# Patient Record
Sex: Female | Born: 1970 | Race: White | Hispanic: No | Marital: Married | Smoking: Current every day smoker
Health system: Southern US, Community
[De-identification: ages and names within clinical notes are randomized; demographics above are authoritative.]

## PROBLEM LIST (undated history)

## (undated) DIAGNOSIS — R7303 Prediabetes: Secondary | ICD-10-CM

## (undated) DIAGNOSIS — N183 Chronic kidney disease, stage 3 unspecified: Secondary | ICD-10-CM

## (undated) DIAGNOSIS — M1990 Unspecified osteoarthritis, unspecified site: Secondary | ICD-10-CM

## (undated) DIAGNOSIS — K219 Gastro-esophageal reflux disease without esophagitis: Secondary | ICD-10-CM

## (undated) DIAGNOSIS — F17200 Nicotine dependence, unspecified, uncomplicated: Secondary | ICD-10-CM

## (undated) DIAGNOSIS — E7800 Pure hypercholesterolemia, unspecified: Secondary | ICD-10-CM

## (undated) DIAGNOSIS — F319 Bipolar disorder, unspecified: Secondary | ICD-10-CM

## (undated) DIAGNOSIS — J45909 Unspecified asthma, uncomplicated: Secondary | ICD-10-CM

## (undated) HISTORY — DX: Pure hypercholesterolemia, unspecified: E78.00

## (undated) HISTORY — DX: Nicotine dependence, unspecified, uncomplicated: F17.200

## (undated) HISTORY — PX: OVARIAN CYST REMOVAL: SHX89

## (undated) HISTORY — PX: TUBAL LIGATION: SHX77

## (undated) HISTORY — DX: Bipolar disorder, unspecified: F31.9

## (undated) HISTORY — DX: Unspecified asthma, uncomplicated: J45.909

## (undated) HISTORY — DX: Gastro-esophageal reflux disease without esophagitis: K21.9

## (undated) HISTORY — DX: Prediabetes: R73.03

## (undated) HISTORY — PX: GALLBLADDER SURGERY: SHX652

## (undated) HISTORY — DX: Chronic kidney disease, stage 3 unspecified: N18.30

---

## 2000-01-02 ENCOUNTER — Ambulatory Visit (HOSPITAL_COMMUNITY): Admission: RE | Admit: 2000-01-02 | Discharge: 2000-01-02 | Payer: Self-pay | Admitting: *Deleted

## 2000-01-06 ENCOUNTER — Encounter: Admission: RE | Admit: 2000-01-06 | Discharge: 2000-01-06 | Payer: Self-pay | Admitting: Family Medicine

## 2000-01-06 ENCOUNTER — Encounter: Payer: Self-pay | Admitting: Family Medicine

## 2007-12-17 ENCOUNTER — Emergency Department (HOSPITAL_COMMUNITY): Admission: EM | Admit: 2007-12-17 | Discharge: 2007-12-17 | Payer: Self-pay | Admitting: Emergency Medicine

## 2008-07-22 ENCOUNTER — Emergency Department (HOSPITAL_COMMUNITY): Admission: EM | Admit: 2008-07-22 | Discharge: 2008-07-22 | Payer: Self-pay | Admitting: Emergency Medicine

## 2008-07-22 ENCOUNTER — Inpatient Hospital Stay (HOSPITAL_COMMUNITY): Admission: AD | Admit: 2008-07-22 | Discharge: 2008-07-24 | Payer: Self-pay | Admitting: *Deleted

## 2008-07-22 ENCOUNTER — Ambulatory Visit: Payer: Self-pay | Admitting: *Deleted

## 2010-03-27 ENCOUNTER — Emergency Department (HOSPITAL_COMMUNITY): Admission: EM | Admit: 2010-03-27 | Discharge: 2010-03-27 | Payer: Self-pay | Admitting: Emergency Medicine

## 2010-12-01 LAB — DIFFERENTIAL
Basophils Absolute: 0.2 10*3/uL — ABNORMAL HIGH (ref 0.0–0.1)
Basophils Relative: 2 % — ABNORMAL HIGH (ref 0–1)
Eosinophils Absolute: 0.4 10*3/uL (ref 0.0–0.7)
Eosinophils Relative: 4 % (ref 0–5)
Lymphocytes Relative: 33 % (ref 12–46)
Lymphs Abs: 3.6 10*3/uL (ref 0.7–4.0)
Monocytes Absolute: 0.6 10*3/uL (ref 0.1–1.0)
Monocytes Relative: 5 % (ref 3–12)
Neutro Abs: 6.3 10*3/uL (ref 1.7–7.7)
Neutrophils Relative %: 56 % (ref 43–77)

## 2010-12-01 LAB — COMPREHENSIVE METABOLIC PANEL
ALT: 19 U/L (ref 0–35)
AST: 17 U/L (ref 0–37)
Albumin: 4 g/dL (ref 3.5–5.2)
Alkaline Phosphatase: 70 U/L (ref 39–117)
BUN: 5 mg/dL — ABNORMAL LOW (ref 6–23)
CO2: 25 mEq/L (ref 19–32)
Calcium: 9 mg/dL (ref 8.4–10.5)
Chloride: 106 mEq/L (ref 96–112)
Creatinine, Ser: 0.93 mg/dL (ref 0.4–1.2)
GFR calc Af Amer: >60 mL/min (ref >60)
GFR calc non Af Amer: >60 mL/min (ref >60)
Glucose, Bld: 102 mg/dL — ABNORMAL HIGH (ref 70–99)
Potassium: 3.7 mEq/L (ref 3.5–5.1)
Sodium: 138 mEq/L (ref 135–145)
Total Bilirubin: 0.4 mg/dL (ref 0.3–1.2)
Total Protein: 6.5 g/dL (ref 6.0–8.3)

## 2010-12-01 LAB — POCT I-STAT, CHEM 8
BUN: 5 mg/dL — ABNORMAL LOW (ref 6–23)
Calcium, Ion: 1.12 mmol/L (ref 1.12–1.32)
Chloride: 105 mEq/L (ref 96–112)
Creatinine, Ser: 1 mg/dL (ref 0.4–1.2)
Glucose, Bld: 96 mg/dL (ref 70–99)
HCT: 42 % (ref 36.0–46.0)
Hemoglobin: 14.3 g/dL (ref 12.0–15.0)
Potassium: 3.7 mEq/L (ref 3.5–5.1)
Sodium: 140 mEq/L (ref 135–145)
TCO2: 24 mmol/L (ref 0–100)

## 2010-12-01 LAB — URINALYSIS, ROUTINE W REFLEX MICROSCOPIC
Bilirubin Urine: NEGATIVE
Glucose, UA: NEGATIVE mg/dL
Hgb urine dipstick: NEGATIVE
Ketones, ur: NEGATIVE mg/dL
Nitrite: NEGATIVE
Protein, ur: NEGATIVE mg/dL
Specific Gravity, Urine: 1.009 (ref 1.005–1.030)
Urobilinogen, UA: 0.2 mg/dL (ref 0.0–1.0)
pH: 6 (ref 5.0–8.0)

## 2010-12-01 LAB — LIPASE, BLOOD: Lipase: 26 U/L (ref 11–59)

## 2010-12-01 LAB — POCT PREGNANCY, URINE: Preg Test, Ur: NEGATIVE

## 2010-12-01 LAB — CBC
HCT: 39.8 % (ref 36.0–46.0)
Hemoglobin: 13.4 g/dL (ref 12.0–15.0)
MCH: 29.3 pg (ref 26.0–34.0)
MCHC: 33.5 g/dL (ref 30.0–36.0)
MCV: 87.4 fL (ref 78.0–100.0)
Platelets: 266 10*3/uL (ref 150–400)
RBC: 4.56 MIL/uL (ref 3.87–5.11)
RDW: 14.1 % (ref 11.5–15.5)
WBC: 11.1 10*3/uL — ABNORMAL HIGH (ref 4.0–10.5)

## 2011-01-28 NOTE — H&P (Signed)
[name], Ramos NO.:  [id number]   MEDICAL RECORD NO.:  [id number]          PATIENT TYPE:  IPS   LOCATION:  0302                          FACILITY:  [facility]   PHYSICIAN:  Jasmine Pang, M.D. DATE OF BIRTH:  01/18/1971   DATE OF ADMISSION:  07/22/2008  DATE OF DISCHARGE:                       PSYCHIATRIC ADMISSION ASSESSMENT   This is a voluntary admission to the services of Dr. Milford Cage.   IDENTIFYING INFORMATION:  This is a 40 year old, separated, white  female.  Apparently the patient has been under quite a bit of stress.  Cynthia Ramos ran up $50,000 in credit card bills.  Cynthia Ramos has initiated  separation to try to relieve herself of some of this financial burden.  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  Apparently, Cynthia Ramos had not had any sleep in 4 or 5 days.  Cynthia Ramos just felt  extremely exhausted.  Cynthia Ramos had asked Cynthia Ramos to come and get their  Ramos, ages 62 and 45, so Cynthia Ramos could go to sleep.  Cynthia Ramos took some Tylenol-  PM, some Benadryl, and ultimately Cynthia Ramos states that Cynthia Ramos took some  oxycodone that was in the medicine cabinet trying to induce sleep.  The  information says that Cynthia Ramos was texting suicidal thoughts.  Cynthia Ramos  became concerned and sent a neighbor over, who called EMS.  Cynthia Ramos was  given Narcan with a remarkable improvement in Cynthia Ramos level of consciousness  in the ED.  The patient is very believable.  Cynthia Ramos evokes empathy.  Cynthia Ramos  UDS was negative.  Cynthia Ramos did have a somewhat elevated Tylenol level at  38.1, which did come down.  That is consistent with Cynthia Ramos having taken the  Tylenol-PM.   PAST PSYCHIATRIC HISTORY:  Cynthia Ramos does not have any.  Cynthia Ramos states that when  Cynthia Ramos was a teenager, there was some thought that Cynthia Ramos was bipolar.  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  Cynthia Ramos was treated with Klonopin for that, and  other than that Cynthia Ramos does not have any  treatment.   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  Cynthia Ramos has been married the one time.  They have 2 Ramos, ages 53 and 49.  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   FAMILY HISTORY:  Both of Cynthia Ramos parents were bipolar.   ALCOHOL AND DRUG HISTORY:  Cynthia Ramos does not have any.   PRIMARY CARE Christ Fullenwider:  Carolyne Fiscal, MD.   MEDICAL PROBLEMS:  Cynthia Ramos is known to have:  1. Hypertension.  2. Asthma.   MEDICATIONS:  None.   DRUG ALLERGIES:  1. SULFA.  2. PENICILLIN.   POSITIVE PHYSICAL FINDINGS:  As already stated, Cynthia Ramos urine drug screen  was negative, Cynthia Ramos had no alcohol, and Cynthia Ramos Tylenol level was initially  elevated but came down to normal.   CURRENTLY PRESCRIBED MEDICATIONS:  1. Albuterol inhaler 2 puffs q.4h p.r.n.  2. Simvastatin 20 mg p.o. q.a.m.  3. Benadryl 25 mg q.6h p.r.n.  4. Tylenol 650 mg p.o. q.4h as needed.  5. Ibuprofen 600 mg p.o. q.8h p.r.n.   MENTAL STATUS EXAM:  Today, Cynthia Ramos is alert and oriented.  Cynthia Ramos was  appropriately groomed, dressed, and nourished.  Cynthia Ramos speech was not  pressured.  Cynthia Ramos mood had a normal range as did Cynthia Ramos affect.  Cynthia Ramos thought  processes are clear, rational, and goal oriented.  Cynthia Ramos states that Cynthia Ramos  realizes that Cynthia Ramos does need help handling the stress.  Judgment and  insight are intact.  Concentration and memory are intact.  Intelligence  is at least average.  Cynthia Ramos denies being suicidal or homicidal.  Cynthia Ramos  denies auditory or visual hallucinations.   DIAGNOSES:   AXIS I:  Cynthia Ramos admitting diagnosis by the ACT Team was major depressive  disorder, recurrent, unspecified; however, we are going to change that  to mood disorder, not otherwise specified.   AXIS II:  None.   AXIS III:  1. Hypertension.  2. Asthma.   AXIS IV:  Severe financial stressors, separation from Ramos.   AXIS V:  Twenty-nine.   The plan is to start Lamictal 25 mg p.o. daily.  We will also give Cynthia Ramos  some Neurontin to help with anxiety.  We are going to allow Cynthia Ramos to  have  100 mg q.4h p.r.n. anxiety and 300 mg at h.s.  Cynthia Ramos is hoping to be able  to return to work on Wednesday, and we will set Cynthia Ramos up with a therapist  as well.      Mickie Leonarda Salon, P.A.-C.      Jasmine Pang, M.D.  Electronically Signed    MD/MEDQ  D:  07/23/2008  T:  07/23/2008  Job:  [id number]

## 2011-01-28 NOTE — Discharge Summary (Signed)
NAMECHERRELLE, PLANTE NO.:  [id number]   MEDICAL RECORD NO.:  [id number]          PATIENT TYPE:  IPS   LOCATION:  0302                          [facility]ACILITY:  [facility]   PHYSICIAN:  Jasmine Pang, M.D. DATE OF BIRTH:  06/28/1971   DATE OF ADMISSION:  07/22/2008  DATE OF DISCHARGE:  07/24/2008                               DISCHARGE SUMMARY   IDENTIFYING INFORMATION:  This is a 40 year old separated white female  who works as a Engineer, civil (consulting), was admitted on a voluntary basis on July 22, 2008.   HISTORY OF PRESENT ILLNESS:  Apparently, the patient has been under  quite a bit of stress.  She states her husband ran out 50,000 dollars  and credit card bills.  She has initiated separation for relief herself  with some of the financial burden.  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  Apparently, she has not had any sleep  in 4-5 days.  She was extremely exhausted.  She have asked her husband  to come and get her sons aged 56 and 14, so she could go to sleep.  She  took some Tylenol PM and some Benadryl, and ultimately states she took  some oxycodone that was in the medicine cabinet trying to induce sleep.  The information says, she was texting suicidal thoughts.  Her husband  became concerned and sent to neighbor of her and called EMS.  She was  given Narcan with remarkable improvement in her level of consciousness  in the ED.  Her UDS was negative.  She did have an elevated Tylenol  level of 38.1, which did come down.  This was consistent with her having  taken the Tylenol PM.   PAST PSYCHIATRIC HISTORY:  She does not have any.  She states that when  she was a teenager, there was some thought that she was bipolar.  She  did not have medications after the birth of her first son.  She had a  postpartum depression.  She was treated with Klonopin for that and other  than that she has not had any previous treatment.   FAMILY HISTORY:  Both her  parents had bipolar disorder.   ALCOHOL AND DRUG HISTORY:  The patient does not have any.   MEDICAL PROBLEMS:  She is known to have hypertension and asthma.   MEDICATIONS.:  1. Albuterol inhaler 2 puffs q.4 h. p.r.n.  2. Simvastatin 20 mg p.o. q.a.m.  3. Benadryl 25 mg q.6 h. p.r.n.  4. Tylenol 650 mg p.o. q.4 h. as needed  5. Ibuprofen 600 mg p.o. q.8 h. p.r.n.   DRUG ALLERGIES:  1. SULFA.  2. PENICILLIN.   PHYSICAL FINDINGS:  The patient had no acute physical or medical  problems noted.  She had a complete physical exam in the ED prior to  admission.   HOSPITAL COURSE:  Upon admission, the patient was continued on her home  medications of albuterol inhaler 2 puffs q.4 h p.r.n. shortness of  breath, simvastatin 20 mg p.o. q.day, Tylenol 650 mg p.o.  as needed  every 4 hours, and ibuprofen 600 mg every 8 hours as needed.  She was  also started on trazodone 50 to 100 mg p.o. q.h.s. p.r.n. insomnia.  In  addition, she was started on Lamictal 25 mg p.o. q.day and Neurontin 100  mg p.o. q.4 h. p.r.n. anxiety, and Neurontin 300 mg p.o. q.h.s.  She  tolerated the Lamictal well, but became very sedated on Neurontin. I  changed her to Ativan 0.25 mg p.o. q.6 h. p.r.n. anxiety. In individual  sessions, the patient was friendly and cooperative.  She stated she had  not been eating or sleeping well.  Recently, she has been on Klonopin  for anxiety. She was on Paxil while ago when in school.  She stated she  was not suicidal when she took the medication.  She only wanted to rest.  She was surprised that 911 was called and she went to the emergency  department and then was sent here.  She discussed her stressors  including finances, her separation from husband and being a single  parent.  She states she become depressed and exhausted.  Again, she was  not suicidal, just wanted to sleep well.  On July 24, 2008, mental  status had improved markedly from admission status.  She  does state   that Neurontin made her feel somewhat anxious.  Neurontin had been  discontinued as indicated above.  She was having poor sleep due to the  Neurontin.  Appetite was good.  However, mood was less depressed and  less anxious.  Affect was consistent with mood.  There was no suicidal  or homicidal ideation.  No thoughts of self-injurious behavior.  No  auditory or visual hallucinations.  No paranoia or delusions.  Thoughts  were logical and goal directed.  Thought content, no predominant theme.  Cognitive was grossly intact.  Insight was good.  Judgment was good.  Impulse control was good.  It was felt, the patient was safe for  discharge today.   DISCHARGE DIAGNOSES:  Axis I:  Mood disorder, not otherwise specified.  Axis II:  None.  Axis III:  Hypertension and asthma.  Axis IV:  Severe (financial stressors, separation from husband,  occupational, being a single mother, burden of psychiatric illness).  Axis V:  Global assessment of functioning was 50 upon discharge.  GAF  was 29 upon admission.  GAF highest past year was 65 to 70.   DISCHARGE PLANS:  There was no specific activity level or dietary  restrictions.   POSTHOSPITAL CARE PLANS:  The patient will go to the [facility] on November 11th at 8:45 a.m.  She will see Shaaron Adler at the counseling center.  She will ask for a psychiatrist  appointment at this time.   DISCHARGE MEDICATIONS:  1. Zocor 20 mg p.o. q.6 h. p.m.  2. Lamictal 25 mg p.o. q.day.  3. Ativan 0.5 mg one and half pill every 6 hours p.r.n. anxiety.      Jasmine Pang, M.D.  Electronically Signed     BHS/MEDQ  D:  07/24/2008  T:  07/25/2008  Job:  [id number]

## 2011-06-10 LAB — COMPREHENSIVE METABOLIC PANEL
ALT: 13
AST: 13
Albumin: 3.7
Alkaline Phosphatase: 59
BUN: 10
CO2: 23
Calcium: 8.8
Chloride: 109
Creatinine, Ser: 0.91
GFR calc Af Amer: >60
GFR calc non Af Amer: >60
Glucose, Bld: 102 — ABNORMAL HIGH
Potassium: 3.5
Sodium: 139
Total Bilirubin: 0.5
Total Protein: 5.9 — ABNORMAL LOW

## 2011-06-10 LAB — CBC
HCT: 37
Hemoglobin: 12.9
MCHC: 34.8
MCV: 86.6
Platelets: 255
RBC: 4.28
RDW: 14.1
WBC: 11.5 — ABNORMAL HIGH

## 2011-06-10 LAB — URINE MICROSCOPIC-ADD ON

## 2011-06-10 LAB — DIFFERENTIAL
Basophils Absolute: 0.1
Basophils Relative: 1
Eosinophils Absolute: 0.4
Eosinophils Relative: 4
Lymphocytes Relative: 34
Lymphs Abs: 3.9
Monocytes Absolute: 0.5
Monocytes Relative: 4
Neutro Abs: 6.6
Neutrophils Relative %: 58

## 2011-06-10 LAB — URINALYSIS, ROUTINE W REFLEX MICROSCOPIC
Bilirubin Urine: NEGATIVE
Glucose, UA: NEGATIVE
Ketones, ur: NEGATIVE
Nitrite: NEGATIVE
Protein, ur: NEGATIVE
Specific Gravity, Urine: 1.008
Urobilinogen, UA: 0.2
pH: 6

## 2011-06-10 LAB — PREGNANCY, URINE: Preg Test, Ur: NEGATIVE

## 2011-06-17 LAB — POCT I-STAT, CHEM 8
BUN: <3 — ABNORMAL LOW
Calcium, Ion: 1.1 — ABNORMAL LOW
Chloride: 106
Creatinine, Ser: 1.3 — ABNORMAL HIGH
Glucose, Bld: 122 — ABNORMAL HIGH
HCT: 43
Hemoglobin: 14.6
Potassium: 3.9
Sodium: 141
TCO2: 23

## 2011-06-17 LAB — SALICYLATE LEVEL: Salicylate Lvl: <4

## 2011-06-17 LAB — HEPATIC FUNCTION PANEL
ALT: 46 — ABNORMAL HIGH
AST: 90 — ABNORMAL HIGH
Albumin: 3.9
Alkaline Phosphatase: 62
Bilirubin, Direct: 0.2
Indirect Bilirubin: 0.3
Total Bilirubin: 0.5
Total Protein: 6.2

## 2011-06-17 LAB — ACETAMINOPHEN LEVEL
Acetaminophen (Tylenol), Serum: 21.4
Acetaminophen (Tylenol), Serum: 32.8 — ABNORMAL HIGH
Acetaminophen (Tylenol), Serum: 38.5 — ABNORMAL HIGH
Acetaminophen (Tylenol), Serum: <10 — ABNORMAL LOW

## 2011-06-17 LAB — RAPID URINE DRUG SCREEN, HOSP PERFORMED
Amphetamines: NOT DETECTED
Barbiturates: NOT DETECTED
Benzodiazepines: NOT DETECTED
Cocaine: NOT DETECTED
Opiates: NOT DETECTED
Tetrahydrocannabinol: NOT DETECTED

## 2011-06-17 LAB — POCT CARDIAC MARKERS
CKMB, poc: <1 — ABNORMAL LOW
Myoglobin, poc: 101
Troponin i, poc: <0.05

## 2011-06-17 LAB — POCT PREGNANCY, URINE: Preg Test, Ur: NEGATIVE

## 2011-06-17 LAB — ETHANOL: Alcohol, Ethyl (B): <5

## 2016-02-14 DIAGNOSIS — F3110 Bipolar disorder, current episode manic without psychotic features, unspecified: Secondary | ICD-10-CM | POA: Diagnosis not present

## 2016-02-14 DIAGNOSIS — F4481 Dissociative identity disorder: Secondary | ICD-10-CM | POA: Diagnosis not present

## 2016-02-14 DIAGNOSIS — F4310 Post-traumatic stress disorder, unspecified: Secondary | ICD-10-CM | POA: Diagnosis not present

## 2016-03-20 DIAGNOSIS — E7800 Pure hypercholesterolemia, unspecified: Secondary | ICD-10-CM | POA: Diagnosis not present

## 2016-04-03 DIAGNOSIS — F4310 Post-traumatic stress disorder, unspecified: Secondary | ICD-10-CM | POA: Diagnosis not present

## 2016-04-03 DIAGNOSIS — F4481 Dissociative identity disorder: Secondary | ICD-10-CM | POA: Diagnosis not present

## 2016-04-03 DIAGNOSIS — F3110 Bipolar disorder, current episode manic without psychotic features, unspecified: Secondary | ICD-10-CM | POA: Diagnosis not present

## 2016-06-04 DIAGNOSIS — F4310 Post-traumatic stress disorder, unspecified: Secondary | ICD-10-CM | POA: Diagnosis not present

## 2016-06-04 DIAGNOSIS — F3110 Bipolar disorder, current episode manic without psychotic features, unspecified: Secondary | ICD-10-CM | POA: Diagnosis not present

## 2016-06-04 DIAGNOSIS — F4481 Dissociative identity disorder: Secondary | ICD-10-CM | POA: Diagnosis not present

## 2016-07-16 DIAGNOSIS — F4310 Post-traumatic stress disorder, unspecified: Secondary | ICD-10-CM | POA: Diagnosis not present

## 2016-07-16 DIAGNOSIS — F3110 Bipolar disorder, current episode manic without psychotic features, unspecified: Secondary | ICD-10-CM | POA: Diagnosis not present

## 2016-07-16 DIAGNOSIS — F4481 Dissociative identity disorder: Secondary | ICD-10-CM | POA: Diagnosis not present

## 2016-10-22 DIAGNOSIS — F3110 Bipolar disorder, current episode manic without psychotic features, unspecified: Secondary | ICD-10-CM | POA: Diagnosis not present

## 2016-10-22 DIAGNOSIS — F4310 Post-traumatic stress disorder, unspecified: Secondary | ICD-10-CM | POA: Diagnosis not present

## 2016-10-22 DIAGNOSIS — F4481 Dissociative identity disorder: Secondary | ICD-10-CM | POA: Diagnosis not present

## 2016-12-29 DIAGNOSIS — E7800 Pure hypercholesterolemia, unspecified: Secondary | ICD-10-CM | POA: Diagnosis not present

## 2016-12-29 DIAGNOSIS — Z0000 Encounter for general adult medical examination without abnormal findings: Secondary | ICD-10-CM | POA: Diagnosis not present

## 2017-01-21 DIAGNOSIS — F4481 Dissociative identity disorder: Secondary | ICD-10-CM | POA: Diagnosis not present

## 2017-01-21 DIAGNOSIS — F4310 Post-traumatic stress disorder, unspecified: Secondary | ICD-10-CM | POA: Diagnosis not present

## 2017-01-21 DIAGNOSIS — F3110 Bipolar disorder, current episode manic without psychotic features, unspecified: Secondary | ICD-10-CM | POA: Diagnosis not present

## 2017-05-26 DIAGNOSIS — F4481 Dissociative identity disorder: Secondary | ICD-10-CM | POA: Diagnosis not present

## 2017-05-26 DIAGNOSIS — F4310 Post-traumatic stress disorder, unspecified: Secondary | ICD-10-CM | POA: Diagnosis not present

## 2017-05-26 DIAGNOSIS — F3110 Bipolar disorder, current episode manic without psychotic features, unspecified: Secondary | ICD-10-CM | POA: Diagnosis not present

## 2017-07-02 DIAGNOSIS — F4310 Post-traumatic stress disorder, unspecified: Secondary | ICD-10-CM | POA: Diagnosis not present

## 2017-07-02 DIAGNOSIS — F4481 Dissociative identity disorder: Secondary | ICD-10-CM | POA: Diagnosis not present

## 2017-07-02 DIAGNOSIS — F319 Bipolar disorder, unspecified: Secondary | ICD-10-CM | POA: Diagnosis not present

## 2017-07-22 DIAGNOSIS — F3110 Bipolar disorder, current episode manic without psychotic features, unspecified: Secondary | ICD-10-CM | POA: Diagnosis not present

## 2017-07-22 DIAGNOSIS — F4310 Post-traumatic stress disorder, unspecified: Secondary | ICD-10-CM | POA: Diagnosis not present

## 2017-07-22 DIAGNOSIS — F4481 Dissociative identity disorder: Secondary | ICD-10-CM | POA: Diagnosis not present

## 2017-08-27 DIAGNOSIS — F4481 Dissociative identity disorder: Secondary | ICD-10-CM | POA: Diagnosis not present

## 2017-08-27 DIAGNOSIS — F4310 Post-traumatic stress disorder, unspecified: Secondary | ICD-10-CM | POA: Diagnosis not present

## 2017-08-27 DIAGNOSIS — F3110 Bipolar disorder, current episode manic without psychotic features, unspecified: Secondary | ICD-10-CM | POA: Diagnosis not present

## 2017-09-17 DIAGNOSIS — F4481 Dissociative identity disorder: Secondary | ICD-10-CM | POA: Diagnosis not present

## 2017-09-17 DIAGNOSIS — F3110 Bipolar disorder, current episode manic without psychotic features, unspecified: Secondary | ICD-10-CM | POA: Diagnosis not present

## 2017-09-17 DIAGNOSIS — F4310 Post-traumatic stress disorder, unspecified: Secondary | ICD-10-CM | POA: Diagnosis not present

## 2017-10-08 DIAGNOSIS — F3110 Bipolar disorder, current episode manic without psychotic features, unspecified: Secondary | ICD-10-CM | POA: Diagnosis not present

## 2017-10-08 DIAGNOSIS — F4310 Post-traumatic stress disorder, unspecified: Secondary | ICD-10-CM | POA: Diagnosis not present

## 2017-10-08 DIAGNOSIS — F4481 Dissociative identity disorder: Secondary | ICD-10-CM | POA: Diagnosis not present

## 2017-10-20 DIAGNOSIS — F4310 Post-traumatic stress disorder, unspecified: Secondary | ICD-10-CM | POA: Diagnosis not present

## 2017-10-20 DIAGNOSIS — F4481 Dissociative identity disorder: Secondary | ICD-10-CM | POA: Diagnosis not present

## 2017-10-20 DIAGNOSIS — F3110 Bipolar disorder, current episode manic without psychotic features, unspecified: Secondary | ICD-10-CM | POA: Diagnosis not present

## 2017-10-29 DIAGNOSIS — F3110 Bipolar disorder, current episode manic without psychotic features, unspecified: Secondary | ICD-10-CM | POA: Diagnosis not present

## 2017-10-29 DIAGNOSIS — F4481 Dissociative identity disorder: Secondary | ICD-10-CM | POA: Diagnosis not present

## 2017-10-29 DIAGNOSIS — F4310 Post-traumatic stress disorder, unspecified: Secondary | ICD-10-CM | POA: Diagnosis not present

## 2017-11-17 DIAGNOSIS — F4481 Dissociative identity disorder: Secondary | ICD-10-CM | POA: Diagnosis not present

## 2017-11-17 DIAGNOSIS — F3110 Bipolar disorder, current episode manic without psychotic features, unspecified: Secondary | ICD-10-CM | POA: Diagnosis not present

## 2017-11-17 DIAGNOSIS — F4310 Post-traumatic stress disorder, unspecified: Secondary | ICD-10-CM | POA: Diagnosis not present

## 2017-12-24 DIAGNOSIS — F4310 Post-traumatic stress disorder, unspecified: Secondary | ICD-10-CM | POA: Diagnosis not present

## 2017-12-24 DIAGNOSIS — F3110 Bipolar disorder, current episode manic without psychotic features, unspecified: Secondary | ICD-10-CM | POA: Diagnosis not present

## 2017-12-24 DIAGNOSIS — F4481 Dissociative identity disorder: Secondary | ICD-10-CM | POA: Diagnosis not present

## 2017-12-31 DIAGNOSIS — F319 Bipolar disorder, unspecified: Secondary | ICD-10-CM | POA: Diagnosis not present

## 2017-12-31 DIAGNOSIS — Z720 Tobacco use: Secondary | ICD-10-CM | POA: Diagnosis not present

## 2017-12-31 DIAGNOSIS — E7800 Pure hypercholesterolemia, unspecified: Secondary | ICD-10-CM | POA: Diagnosis not present

## 2017-12-31 DIAGNOSIS — Z0000 Encounter for general adult medical examination without abnormal findings: Secondary | ICD-10-CM | POA: Diagnosis not present

## 2018-01-13 DIAGNOSIS — F3110 Bipolar disorder, current episode manic without psychotic features, unspecified: Secondary | ICD-10-CM | POA: Diagnosis not present

## 2018-01-13 DIAGNOSIS — F4481 Dissociative identity disorder: Secondary | ICD-10-CM | POA: Diagnosis not present

## 2018-01-13 DIAGNOSIS — F4310 Post-traumatic stress disorder, unspecified: Secondary | ICD-10-CM | POA: Diagnosis not present

## 2018-01-14 DIAGNOSIS — R7301 Impaired fasting glucose: Secondary | ICD-10-CM | POA: Diagnosis not present

## 2018-01-28 DIAGNOSIS — F4481 Dissociative identity disorder: Secondary | ICD-10-CM | POA: Diagnosis not present

## 2018-01-28 DIAGNOSIS — F4310 Post-traumatic stress disorder, unspecified: Secondary | ICD-10-CM | POA: Diagnosis not present

## 2018-01-28 DIAGNOSIS — F3110 Bipolar disorder, current episode manic without psychotic features, unspecified: Secondary | ICD-10-CM | POA: Diagnosis not present

## 2018-03-02 DIAGNOSIS — F4310 Post-traumatic stress disorder, unspecified: Secondary | ICD-10-CM | POA: Diagnosis not present

## 2018-03-02 DIAGNOSIS — F3110 Bipolar disorder, current episode manic without psychotic features, unspecified: Secondary | ICD-10-CM | POA: Diagnosis not present

## 2018-03-02 DIAGNOSIS — F4481 Dissociative identity disorder: Secondary | ICD-10-CM | POA: Diagnosis not present

## 2019-08-10 DIAGNOSIS — F4481 Dissociative identity disorder: Secondary | ICD-10-CM | POA: Diagnosis not present

## 2019-08-10 DIAGNOSIS — F3110 Bipolar disorder, current episode manic without psychotic features, unspecified: Secondary | ICD-10-CM | POA: Diagnosis not present

## 2019-08-10 DIAGNOSIS — F4310 Post-traumatic stress disorder, unspecified: Secondary | ICD-10-CM | POA: Diagnosis not present

## 2019-11-29 DIAGNOSIS — F4481 Dissociative identity disorder: Secondary | ICD-10-CM | POA: Diagnosis not present

## 2019-11-29 DIAGNOSIS — F4310 Post-traumatic stress disorder, unspecified: Secondary | ICD-10-CM | POA: Diagnosis not present

## 2019-11-29 DIAGNOSIS — F3110 Bipolar disorder, current episode manic without psychotic features, unspecified: Secondary | ICD-10-CM | POA: Diagnosis not present

## 2020-01-05 DIAGNOSIS — Z0000 Encounter for general adult medical examination without abnormal findings: Secondary | ICD-10-CM | POA: Diagnosis not present

## 2020-01-05 DIAGNOSIS — E7800 Pure hypercholesterolemia, unspecified: Secondary | ICD-10-CM | POA: Diagnosis not present

## 2020-01-05 DIAGNOSIS — R7303 Prediabetes: Secondary | ICD-10-CM | POA: Diagnosis not present

## 2020-01-06 ENCOUNTER — Other Ambulatory Visit: Payer: Self-pay | Admitting: Physician Assistant

## 2020-01-06 DIAGNOSIS — Z1231 Encounter for screening mammogram for malignant neoplasm of breast: Secondary | ICD-10-CM

## 2020-07-03 DIAGNOSIS — K219 Gastro-esophageal reflux disease without esophagitis: Secondary | ICD-10-CM | POA: Diagnosis not present

## 2020-07-03 DIAGNOSIS — R21 Rash and other nonspecific skin eruption: Secondary | ICD-10-CM | POA: Diagnosis not present

## 2020-07-03 DIAGNOSIS — R6889 Other general symptoms and signs: Secondary | ICD-10-CM | POA: Diagnosis not present

## 2020-07-03 DIAGNOSIS — M2540 Effusion, unspecified joint: Secondary | ICD-10-CM | POA: Diagnosis not present

## 2020-07-03 DIAGNOSIS — Z23 Encounter for immunization: Secondary | ICD-10-CM | POA: Diagnosis not present

## 2020-07-03 DIAGNOSIS — M2550 Pain in unspecified joint: Secondary | ICD-10-CM | POA: Diagnosis not present

## 2020-07-09 DIAGNOSIS — K219 Gastro-esophageal reflux disease without esophagitis: Secondary | ICD-10-CM | POA: Diagnosis not present

## 2020-07-09 DIAGNOSIS — E7800 Pure hypercholesterolemia, unspecified: Secondary | ICD-10-CM | POA: Diagnosis not present

## 2020-07-09 DIAGNOSIS — F17200 Nicotine dependence, unspecified, uncomplicated: Secondary | ICD-10-CM | POA: Diagnosis not present

## 2020-07-09 DIAGNOSIS — R7303 Prediabetes: Secondary | ICD-10-CM | POA: Diagnosis not present

## 2020-07-12 DIAGNOSIS — M549 Dorsalgia, unspecified: Secondary | ICD-10-CM | POA: Diagnosis not present

## 2020-07-12 DIAGNOSIS — I7300 Raynaud's syndrome without gangrene: Secondary | ICD-10-CM | POA: Diagnosis not present

## 2020-07-12 DIAGNOSIS — M1712 Unilateral primary osteoarthritis, left knee: Secondary | ICD-10-CM | POA: Diagnosis not present

## 2020-07-12 DIAGNOSIS — M79672 Pain in left foot: Secondary | ICD-10-CM | POA: Diagnosis not present

## 2020-07-12 DIAGNOSIS — M79671 Pain in right foot: Secondary | ICD-10-CM | POA: Diagnosis not present

## 2020-07-12 DIAGNOSIS — M2550 Pain in unspecified joint: Secondary | ICD-10-CM | POA: Diagnosis not present

## 2020-07-12 DIAGNOSIS — M79641 Pain in right hand: Secondary | ICD-10-CM | POA: Diagnosis not present

## 2020-07-12 DIAGNOSIS — Z8739 Personal history of other diseases of the musculoskeletal system and connective tissue: Secondary | ICD-10-CM | POA: Diagnosis not present

## 2020-07-12 DIAGNOSIS — M1990 Unspecified osteoarthritis, unspecified site: Secondary | ICD-10-CM | POA: Diagnosis not present

## 2020-07-12 DIAGNOSIS — M25561 Pain in right knee: Secondary | ICD-10-CM | POA: Diagnosis not present

## 2020-07-12 DIAGNOSIS — M545 Low back pain, unspecified: Secondary | ICD-10-CM | POA: Diagnosis not present

## 2020-07-12 DIAGNOSIS — R21 Rash and other nonspecific skin eruption: Secondary | ICD-10-CM | POA: Diagnosis not present

## 2020-07-12 DIAGNOSIS — M546 Pain in thoracic spine: Secondary | ICD-10-CM | POA: Diagnosis not present

## 2020-07-12 DIAGNOSIS — M79642 Pain in left hand: Secondary | ICD-10-CM | POA: Diagnosis not present

## 2020-07-12 DIAGNOSIS — M542 Cervicalgia: Secondary | ICD-10-CM | POA: Diagnosis not present

## 2020-07-12 DIAGNOSIS — M25562 Pain in left knee: Secondary | ICD-10-CM | POA: Diagnosis not present

## 2020-07-12 DIAGNOSIS — M1711 Unilateral primary osteoarthritis, right knee: Secondary | ICD-10-CM | POA: Diagnosis not present

## 2020-08-06 DIAGNOSIS — I7300 Raynaud's syndrome without gangrene: Secondary | ICD-10-CM | POA: Diagnosis not present

## 2020-08-06 DIAGNOSIS — M79643 Pain in unspecified hand: Secondary | ICD-10-CM | POA: Diagnosis not present

## 2020-08-06 DIAGNOSIS — T148XXA Other injury of unspecified body region, initial encounter: Secondary | ICD-10-CM | POA: Diagnosis not present

## 2020-08-06 DIAGNOSIS — M7989 Other specified soft tissue disorders: Secondary | ICD-10-CM | POA: Diagnosis not present

## 2020-08-06 DIAGNOSIS — M1990 Unspecified osteoarthritis, unspecified site: Secondary | ICD-10-CM | POA: Diagnosis not present

## 2020-08-07 DIAGNOSIS — F3110 Bipolar disorder, current episode manic without psychotic features, unspecified: Secondary | ICD-10-CM | POA: Diagnosis not present

## 2020-08-07 DIAGNOSIS — F4481 Dissociative identity disorder: Secondary | ICD-10-CM | POA: Diagnosis not present

## 2020-08-07 DIAGNOSIS — F4310 Post-traumatic stress disorder, unspecified: Secondary | ICD-10-CM | POA: Diagnosis not present

## 2020-09-03 DIAGNOSIS — I7300 Raynaud's syndrome without gangrene: Secondary | ICD-10-CM | POA: Diagnosis not present

## 2020-09-03 DIAGNOSIS — M79643 Pain in unspecified hand: Secondary | ICD-10-CM | POA: Diagnosis not present

## 2020-09-03 DIAGNOSIS — M25579 Pain in unspecified ankle and joints of unspecified foot: Secondary | ICD-10-CM | POA: Diagnosis not present

## 2020-09-03 DIAGNOSIS — M1990 Unspecified osteoarthritis, unspecified site: Secondary | ICD-10-CM | POA: Diagnosis not present

## 2020-09-18 DIAGNOSIS — F4481 Dissociative identity disorder: Secondary | ICD-10-CM | POA: Diagnosis not present

## 2020-09-18 DIAGNOSIS — F3110 Bipolar disorder, current episode manic without psychotic features, unspecified: Secondary | ICD-10-CM | POA: Diagnosis not present

## 2020-09-18 DIAGNOSIS — F4310 Post-traumatic stress disorder, unspecified: Secondary | ICD-10-CM | POA: Diagnosis not present

## 2020-10-12 DIAGNOSIS — E7800 Pure hypercholesterolemia, unspecified: Secondary | ICD-10-CM | POA: Diagnosis not present

## 2020-10-15 DIAGNOSIS — M79643 Pain in unspecified hand: Secondary | ICD-10-CM | POA: Diagnosis not present

## 2020-10-15 DIAGNOSIS — M25579 Pain in unspecified ankle and joints of unspecified foot: Secondary | ICD-10-CM | POA: Diagnosis not present

## 2020-10-15 DIAGNOSIS — M1990 Unspecified osteoarthritis, unspecified site: Secondary | ICD-10-CM | POA: Diagnosis not present

## 2020-10-15 DIAGNOSIS — I7300 Raynaud's syndrome without gangrene: Secondary | ICD-10-CM | POA: Diagnosis not present

## 2020-11-09 DIAGNOSIS — I7300 Raynaud's syndrome without gangrene: Secondary | ICD-10-CM | POA: Diagnosis not present

## 2020-11-09 DIAGNOSIS — R0602 Shortness of breath: Secondary | ICD-10-CM | POA: Diagnosis not present

## 2020-11-09 DIAGNOSIS — M25579 Pain in unspecified ankle and joints of unspecified foot: Secondary | ICD-10-CM | POA: Diagnosis not present

## 2020-11-09 DIAGNOSIS — M1990 Unspecified osteoarthritis, unspecified site: Secondary | ICD-10-CM | POA: Diagnosis not present

## 2020-11-09 DIAGNOSIS — M79643 Pain in unspecified hand: Secondary | ICD-10-CM | POA: Diagnosis not present

## 2020-11-21 DIAGNOSIS — F3110 Bipolar disorder, current episode manic without psychotic features, unspecified: Secondary | ICD-10-CM | POA: Diagnosis not present

## 2020-11-21 DIAGNOSIS — F4310 Post-traumatic stress disorder, unspecified: Secondary | ICD-10-CM | POA: Diagnosis not present

## 2020-11-21 DIAGNOSIS — L4050 Arthropathic psoriasis, unspecified: Secondary | ICD-10-CM | POA: Diagnosis not present

## 2020-11-21 DIAGNOSIS — F4481 Dissociative identity disorder: Secondary | ICD-10-CM | POA: Diagnosis not present

## 2020-11-21 DIAGNOSIS — Z79899 Other long term (current) drug therapy: Secondary | ICD-10-CM | POA: Diagnosis not present

## 2020-12-05 DIAGNOSIS — L4050 Arthropathic psoriasis, unspecified: Secondary | ICD-10-CM | POA: Diagnosis not present

## 2020-12-05 DIAGNOSIS — M1990 Unspecified osteoarthritis, unspecified site: Secondary | ICD-10-CM | POA: Diagnosis not present

## 2020-12-07 DIAGNOSIS — M25579 Pain in unspecified ankle and joints of unspecified foot: Secondary | ICD-10-CM | POA: Diagnosis not present

## 2020-12-07 DIAGNOSIS — M1990 Unspecified osteoarthritis, unspecified site: Secondary | ICD-10-CM | POA: Diagnosis not present

## 2020-12-07 DIAGNOSIS — I7300 Raynaud's syndrome without gangrene: Secondary | ICD-10-CM | POA: Diagnosis not present

## 2020-12-07 DIAGNOSIS — M79643 Pain in unspecified hand: Secondary | ICD-10-CM | POA: Diagnosis not present

## 2021-01-01 ENCOUNTER — Encounter (HOSPITAL_COMMUNITY): Payer: Self-pay | Admitting: Emergency Medicine

## 2021-01-01 ENCOUNTER — Emergency Department (HOSPITAL_COMMUNITY)
Admission: EM | Admit: 2021-01-01 | Discharge: 2021-01-01 | Discharge status: Against Medical Advice | Payer: BC Managed Care – PPO | Attending: Student | Admitting: Student

## 2021-01-01 ENCOUNTER — Other Ambulatory Visit: Payer: Self-pay

## 2021-01-01 DIAGNOSIS — R519 Headache, unspecified: Secondary | ICD-10-CM | POA: Insufficient documentation

## 2021-01-01 DIAGNOSIS — Z5321 Procedure and treatment not carried out due to patient leaving prior to being seen by health care provider: Secondary | ICD-10-CM | POA: Diagnosis not present

## 2021-01-01 HISTORY — DX: Unspecified osteoarthritis, unspecified site: M19.90

## 2021-01-01 LAB — CBC
HCT: 42.1 % (ref 36.0–46.0)
Hemoglobin: 13.7 g/dL (ref 12.0–15.0)
MCH: 29.5 pg (ref 26.0–34.0)
MCHC: 32.5 g/dL (ref 30.0–36.0)
MCV: 90.5 fL (ref 80.0–100.0)
Platelets: 240 10*3/uL (ref 150–400)
RBC: 4.65 MIL/uL (ref 3.87–5.11)
RDW: 13.4 % (ref 11.5–15.5)
WBC: 15.6 10*3/uL — ABNORMAL HIGH (ref 4.0–10.5)
nRBC: 0 % (ref 0.0–0.2)

## 2021-01-01 LAB — BASIC METABOLIC PANEL
Anion gap: 8 (ref 5–15)
BUN: 7 mg/dL (ref 6–20)
CO2: 30 mmol/L (ref 22–32)
Calcium: 9.1 mg/dL (ref 8.9–10.3)
Chloride: 103 mmol/L (ref 98–111)
Creatinine, Ser: 1.13 mg/dL — ABNORMAL HIGH (ref 0.44–1.00)
GFR, Estimated: 60 mL/min — ABNORMAL LOW (ref >60)
Glucose, Bld: 118 mg/dL — ABNORMAL HIGH (ref 70–99)
Potassium: 3.2 mmol/L — ABNORMAL LOW (ref 3.5–5.1)
Sodium: 141 mmol/L (ref 135–145)

## 2021-01-01 NOTE — ED Triage Notes (Signed)
Emergency Medicine Provider Triage Evaluation Note  Cynthia Ramos , a 50 y.o. female  was evaluated in triage.  Pt complains of her BP going up and then down and then having HA. No CP or SOB. Not on any blood pressure meds.   Review of Systems  Positive: HA Negative: CP, SOB  Physical Exam  There were no vitals taken for this visit. Gen:   Awake, no distress   HEENT:  Atraumatic  Resp:  Normal effort  Cardiac:  Normal rate  Abd:   Nondistended, nontender MSK:   Moves extremities without difficulty  Neuro:  Speech clear   Medical Decision Making  Medically screening exam initiated at 6:14 PM.  Appropriate orders placed.  CHAELA BRANSCUM was informed that the remainder of the evaluation will be completed by another provider, this initial triage assessment does not replace that evaluation, and the importance of remaining in the ED until their evaluation is complete.  Clinical Impression  MSE was initiated and I personally evaluated the patient and placed orders (if any) at  6:15 PM on January 01, 2021.  The patient appears stable so that the remainder of the MSE may be completed by another provider.    Farrel Gordon, PA-C 01/01/21 1828

## 2021-01-01 NOTE — ED Notes (Signed)
Pt did not respond when called for vitals check 

## 2021-01-01 NOTE — ED Notes (Signed)
Called pat x2 for vitals with no response

## 2021-01-01 NOTE — ED Triage Notes (Signed)
Pt reports that her BP has been fluctuating. States when its high she gets a headache and when it's low she gets sick on her stomach. States she is not currently taking BP medications. Denies chest pain/shortness of breath.

## 2021-01-02 DIAGNOSIS — L4050 Arthropathic psoriasis, unspecified: Secondary | ICD-10-CM | POA: Diagnosis not present

## 2021-01-03 DIAGNOSIS — F3110 Bipolar disorder, current episode manic without psychotic features, unspecified: Secondary | ICD-10-CM | POA: Diagnosis not present

## 2021-01-03 DIAGNOSIS — F4310 Post-traumatic stress disorder, unspecified: Secondary | ICD-10-CM | POA: Diagnosis not present

## 2021-01-03 DIAGNOSIS — F4481 Dissociative identity disorder: Secondary | ICD-10-CM | POA: Diagnosis not present

## 2021-01-05 ENCOUNTER — Emergency Department (HOSPITAL_COMMUNITY): Payer: BC Managed Care – PPO

## 2021-01-05 ENCOUNTER — Other Ambulatory Visit: Payer: Self-pay

## 2021-01-05 ENCOUNTER — Emergency Department (HOSPITAL_COMMUNITY)
Admission: EM | Admit: 2021-01-05 | Discharge: 2021-01-06 | Disposition: A | Discharge status: Home / Self Care | Payer: BC Managed Care – PPO | Attending: Emergency Medicine | Admitting: Emergency Medicine

## 2021-01-05 DIAGNOSIS — J189 Pneumonia, unspecified organism: Secondary | ICD-10-CM | POA: Diagnosis not present

## 2021-01-05 DIAGNOSIS — R509 Fever, unspecified: Secondary | ICD-10-CM

## 2021-01-05 DIAGNOSIS — A419 Sepsis, unspecified organism: Secondary | ICD-10-CM | POA: Diagnosis not present

## 2021-01-05 DIAGNOSIS — R110 Nausea: Secondary | ICD-10-CM | POA: Diagnosis not present

## 2021-01-05 DIAGNOSIS — L4052 Psoriatic arthritis mutilans: Secondary | ICD-10-CM | POA: Insufficient documentation

## 2021-01-05 DIAGNOSIS — R9431 Abnormal electrocardiogram [ECG] [EKG]: Secondary | ICD-10-CM | POA: Diagnosis not present

## 2021-01-05 DIAGNOSIS — R112 Nausea with vomiting, unspecified: Secondary | ICD-10-CM | POA: Diagnosis not present

## 2021-01-05 DIAGNOSIS — L4050 Arthropathic psoriasis, unspecified: Secondary | ICD-10-CM

## 2021-01-05 DIAGNOSIS — R000 Tachycardia, unspecified: Secondary | ICD-10-CM | POA: Diagnosis not present

## 2021-01-05 DIAGNOSIS — Z20822 Contact with and (suspected) exposure to covid-19: Secondary | ICD-10-CM | POA: Diagnosis not present

## 2021-01-05 DIAGNOSIS — J181 Lobar pneumonia, unspecified organism: Secondary | ICD-10-CM | POA: Diagnosis not present

## 2021-01-05 MED ORDER — ACETAMINOPHEN 325 MG PO TABS
650.0000 mg | ORAL_TABLET | Freq: Once | ORAL | Status: AC
  Administered 2021-01-06: 650 mg via ORAL
  Filled 2021-01-05: qty 2

## 2021-01-05 NOTE — ED Provider Notes (Signed)
[facility]-EMERGENCY DEPT Provider Note   CSN: [id number] Arrival date & time: 01/05/21  2202     History Chief Complaint  Patient presents with  . Fever    Cynthia Ramos is a 50 y.o. female.  Cynthia Ramos got sick after receiving an IV infusion for psoriatic arthritis.  She has had a fever, nausea, and vomiting.  She has been vaccinated for COVID-19 but not boosted.  No sick contacts.  The history is provided by the patient.  Fever Max temp prior to arrival:  103 Temp source:  Oral Severity:  Moderate Onset quality:  Sudden Duration:  2 days Timing:  Constant Progression:  Unchanged Chronicity:  New Relieved by:  Nothing Worsened by:  Nothing Ineffective treatments:  None tried Associated symptoms: nausea and vomiting   Associated symptoms: no chest pain, no chills, no cough, no diarrhea, no dysuria, no ear pain, no headaches, no rash and no sore throat   Risk factors: immunosuppression   Risk factors: no sick contacts        Past Medical History:  Diagnosis Date  . Arthritis     There are no problems to display for this patient.     OB History   No obstetric history on file.     No family history on file.     Home Medications Prior to Admission medications   Not on File    Allergies    Meloxicam, Other, Penicillin g, and Sulfa antibiotics  Review of Systems   Review of Systems  Constitutional: Positive for fever. Negative for chills.  HENT: Negative for ear pain and sore throat.   Eyes: Negative for pain and visual disturbance.  Respiratory: Negative for cough and shortness of breath.   Cardiovascular: Negative for chest pain and palpitations.  Gastrointestinal: Positive for nausea and vomiting. Negative for abdominal pain and diarrhea.  Genitourinary: Negative for dysuria and hematuria.  Musculoskeletal: Negative for arthralgias and back pain.  Skin: Negative for color change and rash.  Neurological: Negative for  seizures, syncope and headaches.  All other systems reviewed and are negative.   Physical Exam Updated Vital Signs BP 117/80 (BP Location: Left Arm)   Pulse (!) 105   Temp (!) 100.6 F (38.1 C) (Oral) Comment: 1000 mg acetaminophen taken @ 18:00  Resp 20   Ht 5\' 3"  (1.6 m)   Wt 88 kg   SpO2 96%   BMI 34.38 kg/m   Physical Exam Vitals and nursing note reviewed.  Constitutional:      General: She is not in acute distress.    Appearance: She is well-developed.  HENT:     Head: Normocephalic and atraumatic.  Eyes:     Conjunctiva/sclera: Conjunctivae normal.  Cardiovascular:     Rate and Rhythm: Regular rhythm. Tachycardia present.     Heart sounds: No murmur heard.   Pulmonary:     Effort: Pulmonary effort is normal. No respiratory distress.     Breath sounds: Wheezing present.     Comments: Mild, diffuse wheezing in keeping with her history of asthma Abdominal:     Palpations: Abdomen is soft.     Tenderness: There is no abdominal tenderness.  Musculoskeletal:     Cervical back: Neck supple.  Skin:    General: Skin is warm and dry.  Neurological:     General: No focal deficit present.     Mental Status: She is alert.  Psychiatric:        Mood  and Affect: Mood normal.     ED Results / Procedures / Treatments   Labs (all labs ordered are listed, but only abnormal results are displayed) Labs Reviewed  COMPREHENSIVE METABOLIC PANEL - Abnormal; Notable for the following components:      Result Value   Glucose, Bld 112 (*)    Creatinine, Ser 1.25 (*)    AST 11 (*)    GFR, Estimated 53 (*)    All other components within normal limits  CBC WITH DIFFERENTIAL/PLATELET - Abnormal; Notable for the following components:   WBC 14.1 (*)    Neutro Abs 9.7 (*)    Monocytes Absolute 1.4 (*)    All other components within normal limits  URINALYSIS, ROUTINE W REFLEX MICROSCOPIC - Abnormal; Notable for the following components:   APPearance HAZY (*)    Hgb urine dipstick  SMALL (*)    Protein, ur 30 (*)    Leukocytes,Ua SMALL (*)    Bacteria, UA RARE (*)    All other components within normal limits  RESP PANEL BY RT-PCR (FLU A&B, COVID) ARPGX2  URINE CULTURE  CULTURE, BLOOD (ROUTINE X 2)  CULTURE, BLOOD (ROUTINE X 2)  LACTIC ACID, PLASMA  PROTIME-INR  APTT    EKG None  Radiology CT Chest W Contrast  Result Date: 01/06/2021 CLINICAL DATA:  Fever with nausea and vomiting EXAM: CT CHEST WITH CONTRAST TECHNIQUE: Multidetector CT imaging of the chest was performed during intravenous contrast administration. CONTRAST:  5mL OMNIPAQUE IOHEXOL 300 MG/ML  SOLN COMPARISON:  None. FINDINGS: Cardiovascular: No significant vascular findings. Normal heart size. No pericardial effusion. Mediastinum/Nodes: No enlarged mediastinal, hilar, or axillary lymph nodes. Thyroid gland, trachea, and esophagus demonstrate no significant findings. Lungs/Pleura: Area of consolidation measuring 3.8 x 3.4 cm. No pleural effusion or pneumothorax. The lungs are otherwise clear. Upper Abdomen: No acute abnormality. Musculoskeletal: No chest wall abnormality. No acute or significant osseous findings. IMPRESSION: Area of consolidation in the right upper lobe, concerning for pneumonia. Followup PA and lateral chest X-ray is recommended in 3-4 weeks following trial of antibiotic therapy to ensure resolution and exclude underlying malignancy. Electronically Signed   By: Deatra Robinson M.D.   On: 01/06/2021 02:35   DG Chest Port 1 View  Result Date: 01/05/2021 CLINICAL DATA:  Fever.  Nausea and vomiting. Questionable sepsis - evaluate for abnormality EXAM: PORTABLE CHEST 1 VIEW COMPARISON:  None. FINDINGS: The heart is normal in size. There is a ovoid left suprahilar masslike opacity spanning approximately 4.5 cm. No other focal airspace disease. No pneumothorax or pleural effusion. No acute osseous abnormalities are seen. IMPRESSION: Left suprahilar masslike opacity. Differential considerations  include asymmetric hilar adenopathy, pulmonary mass, or pneumonia. Recommend chest CT (with IV contrast in the absence of contraindication) for further assessment. Electronically Signed   By: Narda Rutherford M.D.   On: 01/05/2021 23:20    Procedures Procedures   Medications Ordered in ED Medications  acetaminophen (TYLENOL) tablet 650 mg (650 mg Oral Given 01/06/21 0027)  iohexol (OMNIPAQUE) 300 MG/ML solution 75 mL (75 mLs Intravenous Contrast Given 01/06/21 0203)  levofloxacin (LEVAQUIN) tablet 750 mg (750 mg Oral Given 01/06/21 5056)    ED Course  I have reviewed the triage vital signs and the nursing notes.  Pertinent labs & imaging results that were available during my care of the patient were reviewed by me and considered in my medical decision making (see chart for details).    MDM Rules/Calculators/A&P  LEEAN AMEZCUA has a history of psoriatic arthritis and receives immunosuppressive medications.  She presents with a fever and nonspecific symptoms.  She will be evaluated for evidence of a viral or bacterial illness. Final Clinical Impression(s) / ED Diagnoses Final diagnoses:  Febrile illness  Psoriatic arthritis (HCC)  Community acquired pneumonia of left upper lobe of lung    Rx / DC Orders ED Discharge Orders         Ordered    levofloxacin (LEVAQUIN) 750 MG tablet  Daily        01/06/21 0251           Koleen Distance, MD 01/06/21 [phone]

## 2021-01-05 NOTE — ED Triage Notes (Signed)
Pt came in with c/o fever and n/v. Pt got an infusion for her psoriatic arthritis on Wednesday, and she started running a fever on Thursday. Pt had neg Covid test yesterday.

## 2021-01-06 ENCOUNTER — Encounter (HOSPITAL_COMMUNITY): Payer: Self-pay

## 2021-01-06 ENCOUNTER — Emergency Department (HOSPITAL_COMMUNITY): Payer: BC Managed Care – PPO

## 2021-01-06 DIAGNOSIS — J189 Pneumonia, unspecified organism: Secondary | ICD-10-CM | POA: Diagnosis not present

## 2021-01-06 DIAGNOSIS — J181 Lobar pneumonia, unspecified organism: Secondary | ICD-10-CM | POA: Diagnosis not present

## 2021-01-06 DIAGNOSIS — R112 Nausea with vomiting, unspecified: Secondary | ICD-10-CM | POA: Diagnosis not present

## 2021-01-06 DIAGNOSIS — R509 Fever, unspecified: Secondary | ICD-10-CM | POA: Diagnosis not present

## 2021-01-06 DIAGNOSIS — R9431 Abnormal electrocardiogram [ECG] [EKG]: Secondary | ICD-10-CM | POA: Diagnosis not present

## 2021-01-06 DIAGNOSIS — L4052 Psoriatic arthritis mutilans: Secondary | ICD-10-CM | POA: Diagnosis not present

## 2021-01-06 LAB — COMPREHENSIVE METABOLIC PANEL
ALT: 13 U/L (ref 0–44)
AST: 11 U/L — ABNORMAL LOW (ref 15–41)
Albumin: 4.2 g/dL (ref 3.5–5.0)
Alkaline Phosphatase: 57 U/L (ref 38–126)
Anion gap: 10 (ref 5–15)
BUN: 9 mg/dL (ref 6–20)
CO2: 26 mmol/L (ref 22–32)
Calcium: 9.3 mg/dL (ref 8.9–10.3)
Chloride: 99 mmol/L (ref 98–111)
Creatinine, Ser: 1.25 mg/dL — ABNORMAL HIGH (ref 0.44–1.00)
GFR, Estimated: 53 mL/min — ABNORMAL LOW (ref >60)
Glucose, Bld: 112 mg/dL — ABNORMAL HIGH (ref 70–99)
Potassium: 3.7 mmol/L (ref 3.5–5.1)
Sodium: 135 mmol/L (ref 135–145)
Total Bilirubin: 0.8 mg/dL (ref 0.3–1.2)
Total Protein: 8 g/dL (ref 6.5–8.1)

## 2021-01-06 LAB — URINALYSIS, ROUTINE W REFLEX MICROSCOPIC
Bilirubin Urine: NEGATIVE
Glucose, UA: NEGATIVE mg/dL
Ketones, ur: NEGATIVE mg/dL
Nitrite: NEGATIVE
Protein, ur: 30 mg/dL — AB
Specific Gravity, Urine: 1.02 (ref 1.005–1.030)
pH: 6 (ref 5.0–8.0)

## 2021-01-06 LAB — CBC WITH DIFFERENTIAL/PLATELET
Abs Immature Granulocytes: 0.06 10*3/uL (ref 0.00–0.07)
Basophils Absolute: 0.1 10*3/uL (ref 0.0–0.1)
Basophils Relative: 1 %
Eosinophils Absolute: 0.1 10*3/uL (ref 0.0–0.5)
Eosinophils Relative: 0 %
HCT: 43.6 % (ref 36.0–46.0)
Hemoglobin: 14.4 g/dL (ref 12.0–15.0)
Immature Granulocytes: 0 %
Lymphocytes Relative: 20 %
Lymphs Abs: 2.8 10*3/uL (ref 0.7–4.0)
MCH: 29.5 pg (ref 26.0–34.0)
MCHC: 33 g/dL (ref 30.0–36.0)
MCV: 89.3 fL (ref 80.0–100.0)
Monocytes Absolute: 1.4 10*3/uL — ABNORMAL HIGH (ref 0.1–1.0)
Monocytes Relative: 10 %
Neutro Abs: 9.7 10*3/uL — ABNORMAL HIGH (ref 1.7–7.7)
Neutrophils Relative %: 69 %
Platelets: 244 10*3/uL (ref 150–400)
RBC: 4.88 MIL/uL (ref 3.87–5.11)
RDW: 13.3 % (ref 11.5–15.5)
WBC: 14.1 10*3/uL — ABNORMAL HIGH (ref 4.0–10.5)
nRBC: 0 % (ref 0.0–0.2)

## 2021-01-06 LAB — RESP PANEL BY RT-PCR (FLU A&B, COVID) ARPGX2
Influenza A by PCR: NEGATIVE
Influenza B by PCR: NEGATIVE
SARS Coronavirus 2 by RT PCR: NEGATIVE

## 2021-01-06 LAB — LACTIC ACID, PLASMA: Lactic Acid, Venous: 1.1 mmol/L (ref 0.5–1.9)

## 2021-01-06 LAB — APTT: aPTT: 35 seconds (ref 24–36)

## 2021-01-06 LAB — PROTIME-INR
INR: 1.2 (ref 0.8–1.2)
Prothrombin Time: 14.7 seconds (ref 11.4–15.2)

## 2021-01-06 MED ORDER — IOHEXOL 300 MG/ML  SOLN
75.0000 mL | Freq: Once | INTRAMUSCULAR | Status: AC | PRN
  Administered 2021-01-06: 75 mL via INTRAVENOUS

## 2021-01-06 MED ORDER — LEVOFLOXACIN 750 MG PO TABS
750.0000 mg | ORAL_TABLET | Freq: Once | ORAL | Status: AC
  Administered 2021-01-06: 750 mg via ORAL
  Filled 2021-01-06: qty 1

## 2021-01-06 MED ORDER — LEVOFLOXACIN 750 MG PO TABS: 750.0000 mg | ORAL_TABLET | Freq: Every day | ORAL | 0 refills | Status: DC

## 2021-01-06 NOTE — ED Provider Notes (Signed)
Care assumed from Dr. Delford Field at shift change.  Patient with history of psoriatic arthritis presenting with complaints of fever after receiving an infusion for her arthritis.  She arrives here with a temperature of 100.8, but otherwise stable vital signs and no hypoxia.  Febrile work-up was initiated by Dr. Delford Field and revealed an infiltrate in the left upper lobe.  There were recommendations from radiology for a CT scan to rule out underlying mass.  Care was signed out to me awaiting results of the study.  The CT scan has returned and shows an area of consolidation in the left upper lobe concerning for pneumonia.  Patient has been reassessed and breath sounds are clear.  Her oxygen saturations are 100% while I am examining her.  Her vital signs are otherwise stable and appears appropriate for discharge.  Patient will be prescribed Levaquin and is to follow-up with primary doctor.   Geoffery Lyons, MD 01/06/21 [phone]

## 2021-01-06 NOTE — Discharge Instructions (Addendum)
Begin taking Levaquin as prescribed.  Continue other medications as previously prescribed.  Follow-up with primary doctor in the next week, and return to the ER if you develop severe chest pain, difficulty breathing, or other new and concerning symptoms.

## 2021-01-07 LAB — URINE CULTURE

## 2021-01-08 DIAGNOSIS — I998 Other disorder of circulatory system: Secondary | ICD-10-CM | POA: Diagnosis not present

## 2021-01-08 DIAGNOSIS — B370 Candidal stomatitis: Secondary | ICD-10-CM | POA: Diagnosis not present

## 2021-01-08 DIAGNOSIS — J189 Pneumonia, unspecified organism: Secondary | ICD-10-CM | POA: Diagnosis not present

## 2021-01-08 DIAGNOSIS — R110 Nausea: Secondary | ICD-10-CM | POA: Diagnosis not present

## 2021-01-11 LAB — CULTURE, BLOOD (ROUTINE X 2)
Culture: NO GROWTH
Culture: NO GROWTH
Special Requests: ADEQUATE
Special Requests: ADEQUATE

## 2021-01-24 DIAGNOSIS — I998 Other disorder of circulatory system: Secondary | ICD-10-CM | POA: Diagnosis not present

## 2021-01-24 DIAGNOSIS — B370 Candidal stomatitis: Secondary | ICD-10-CM | POA: Diagnosis not present

## 2021-02-08 DIAGNOSIS — L4050 Arthropathic psoriasis, unspecified: Secondary | ICD-10-CM | POA: Diagnosis not present

## 2021-02-08 DIAGNOSIS — Z79899 Other long term (current) drug therapy: Secondary | ICD-10-CM | POA: Diagnosis not present

## 2021-02-08 DIAGNOSIS — M1990 Unspecified osteoarthritis, unspecified site: Secondary | ICD-10-CM | POA: Diagnosis not present

## 2021-02-08 DIAGNOSIS — I7300 Raynaud's syndrome without gangrene: Secondary | ICD-10-CM | POA: Diagnosis not present

## 2021-02-19 DIAGNOSIS — F3110 Bipolar disorder, current episode manic without psychotic features, unspecified: Secondary | ICD-10-CM | POA: Diagnosis not present

## 2021-02-19 DIAGNOSIS — F4310 Post-traumatic stress disorder, unspecified: Secondary | ICD-10-CM | POA: Diagnosis not present

## 2021-02-19 DIAGNOSIS — F4481 Dissociative identity disorder: Secondary | ICD-10-CM | POA: Diagnosis not present

## 2021-02-27 DIAGNOSIS — L4050 Arthropathic psoriasis, unspecified: Secondary | ICD-10-CM | POA: Diagnosis not present

## 2021-03-11 DIAGNOSIS — R0600 Dyspnea, unspecified: Secondary | ICD-10-CM | POA: Diagnosis not present

## 2021-03-11 DIAGNOSIS — M1990 Unspecified osteoarthritis, unspecified site: Secondary | ICD-10-CM | POA: Diagnosis not present

## 2021-03-11 DIAGNOSIS — Z79899 Other long term (current) drug therapy: Secondary | ICD-10-CM | POA: Diagnosis not present

## 2021-03-11 DIAGNOSIS — I7300 Raynaud's syndrome without gangrene: Secondary | ICD-10-CM | POA: Diagnosis not present

## 2021-03-11 DIAGNOSIS — L4050 Arthropathic psoriasis, unspecified: Secondary | ICD-10-CM | POA: Diagnosis not present

## 2021-03-20 ENCOUNTER — Ambulatory Visit: Payer: BC Managed Care – PPO | Admitting: Interventional Cardiology

## 2021-03-28 DIAGNOSIS — F3110 Bipolar disorder, current episode manic without psychotic features, unspecified: Secondary | ICD-10-CM | POA: Diagnosis not present

## 2021-03-28 DIAGNOSIS — F4481 Dissociative identity disorder: Secondary | ICD-10-CM | POA: Diagnosis not present

## 2021-03-28 DIAGNOSIS — F4310 Post-traumatic stress disorder, unspecified: Secondary | ICD-10-CM | POA: Diagnosis not present

## 2021-04-24 DIAGNOSIS — L4050 Arthropathic psoriasis, unspecified: Secondary | ICD-10-CM | POA: Diagnosis not present

## 2021-05-06 DIAGNOSIS — Z79899 Other long term (current) drug therapy: Secondary | ICD-10-CM | POA: Diagnosis not present

## 2021-05-06 DIAGNOSIS — M1990 Unspecified osteoarthritis, unspecified site: Secondary | ICD-10-CM | POA: Diagnosis not present

## 2021-05-06 DIAGNOSIS — L4050 Arthropathic psoriasis, unspecified: Secondary | ICD-10-CM | POA: Diagnosis not present

## 2021-05-06 DIAGNOSIS — I7300 Raynaud's syndrome without gangrene: Secondary | ICD-10-CM | POA: Diagnosis not present

## 2021-05-08 ENCOUNTER — Ambulatory Visit: Payer: BC Managed Care – PPO | Admitting: Interventional Cardiology

## 2021-05-08 ENCOUNTER — Encounter: Payer: Self-pay | Admitting: Interventional Cardiology

## 2021-05-08 ENCOUNTER — Other Ambulatory Visit: Payer: Self-pay

## 2021-05-08 VITALS — BP 136/80 | HR 78 | Ht 63.0 in | Wt 181.2 lb

## 2021-05-08 DIAGNOSIS — R7303 Prediabetes: Secondary | ICD-10-CM | POA: Diagnosis not present

## 2021-05-08 DIAGNOSIS — E782 Mixed hyperlipidemia: Secondary | ICD-10-CM | POA: Diagnosis not present

## 2021-05-08 DIAGNOSIS — Z720 Tobacco use: Secondary | ICD-10-CM

## 2021-05-08 DIAGNOSIS — I998 Other disorder of circulatory system: Secondary | ICD-10-CM | POA: Diagnosis not present

## 2021-05-08 NOTE — Patient Instructions (Signed)
Medication Instructions:  Your physician recommends that you continue on your current medications as directed. Please refer to the Current Medication list given to you today.  *If you need a refill on your cardiac medications before your next appointment, please call your pharmacy*   Lab Work: NONE If you have labs (blood work) drawn today and your tests are completely normal, you will receive your results only by: MyChart Message (if you have MyChart) OR A paper copy in the mail If you have any lab test that is abnormal or we need to change your treatment, we will call you to review the results.   Testing/Procedures: Your physician has requested that you have an echocardiogram. Echocardiography is a painless test that uses sound waves to create images of your heart. It provides your doctor with information about the size and shape of your heart and how well your heart's chambers and valves are working. This procedure takes approximately one hour. There are no restrictions for this procedure.    Follow-Up-  Based on the results of Echocardiogram  At [facility], you and your health needs are our priority.  As part of our continuing mission to provide you with exceptional heart care, we have created designated Provider Care Teams.  These Care Teams include your primary Cardiologist (physician) and Advanced Practice Providers (APPs -  Physician Assistants and Nurse Practitioners) who all work together to provide you with the care you need, when you need it.

## 2021-05-08 NOTE — Progress Notes (Signed)
Cardiology Office Note   Date:  05/08/2021   ID:  Cynthia Ramos, DOB March 04, 1971, MRN [id number]  PCP:  Milus Height, PA    No chief complaint on file.  Variable BP  Wt Readings from Last 3 Encounters:  05/08/21 181 lb 3.2 oz (82.2 kg)  01/05/21 194 lb 1.6 oz (88 kg)       History of Present Illness: Cynthia Ramos is a 50 y.o. female who is being seen today for the evaluation of variable blood pressure at the request of Pahwani, Kasandra Knudsen, MD.   Per the chart, she began having fluctuating blood pressures after starting on Simponi infusion for psoriatic arthritis in March 2022.  She has had elevated readings as high as 216/98 and low readings of 76/55 with severe headache.  More recently, she brought her blood pressure log to her primary care doctor and found to have blood pressures in the 130s over 90s but ranging down to the 80s over 30s.  She feels lightheaded and dizzy with the low blood pressure readings.  She has been encouraged to drink plenty of water and follow a high sodium diet.  She continues to have the Simponi infusions.  SHe tried methotrexate but did not tolerate.  Leflunomide was stopped to see if this helped the BP.  No prior cardiac issues.  She was working as Haematologist until a migraine happened and then the arthritis, kidney issues followed.    Low BPs can resolve very quickly.  She has associated dizziness and sweating with the low BPs.     Past Medical History:  Diagnosis Date   Arthritis    Bipolar disorder (HCC)    Bipolar disorder, unspecified (HCC)    CKD (chronic kidney disease), stage III (HCC)    GERD (gastroesophageal reflux disease)    High cholesterol    Prediabetes    Reactive airway disease    Tobacco dependency        Current Outpatient Medications  Medication Sig Dispense Refill   acetaminophen (TYLENOL) 325 MG tablet Take 650 mg by mouth every 6 (six) hours as needed.     albuterol (VENTOLIN HFA) 108 (90 Base) MCG/ACT  inhaler Inhale 2 puffs into the lungs every 4 (four) hours as needed.     atorvastatin (LIPITOR) 20 MG tablet Take 1 tablet by mouth daily.     EPINEPHrine 0.15 MG/0.15ML IJ injection Inject 0.15 mg into the muscle as needed for anaphylaxis.     escitalopram (LEXAPRO) 20 MG tablet Take 20 mg by mouth in the morning and at bedtime.     esomeprazole (NEXIUM) 40 MG capsule Take 40 mg by mouth daily.     folic acid (FOLVITE) 1 MG tablet Take 1 mg by mouth daily.     gabapentin (NEURONTIN) 100 MG capsule Take 100 mg by mouth as needed. Per patient taking 200 mg     hydroxychloroquine (PLAQUENIL) 200 MG tablet Take 200 mg by mouth 2 (two) times daily.     levofloxacin (LEVAQUIN) 750 MG tablet Take 1 tablet (750 mg total) by mouth daily. X 7 days 7 tablet 0   nystatin (MYCOSTATIN) 100000 UNIT/ML suspension Take 5 mLs by mouth 4 (four) times daily.     ondansetron (ZOFRAN) 4 MG tablet Take 4 mg by mouth every 6 (six) hours as needed for nausea or vomiting.     predniSONE (DELTASONE) 5 MG tablet Take 5 mg by mouth daily with breakfast. Per patient taking 1  1/2 tablet daily     QUEtiapine (SEROQUEL XR) 300 MG 24 hr tablet Take 1 tablet by mouth every evening.     traMADol (ULTRAM) 50 MG tablet Take 50 mg by mouth daily as needed.     No current facility-administered medications for this visit.    Allergies:   Meloxicam, Methotrexate derivatives, Aspirin, Penicillin g, and Sulfa antibiotics    Social History:  The patient  reports that she has been smoking cigarettes. She has a 30.00 pack-year smoking history. She has never used smokeless tobacco. She reports that she does not drink alcohol and does not use drugs.   Family History:  The patient's family history includes Heart attack in her brother. Adopted. Several people in biological family with MI or CAD.    ROS:  Please see the history of present illness.   Otherwise, review of systems are positive for not really trying to qiut smoking;  bipolar/anxiety meds were stopped; minimal orthostatic sx; dizzy spells can occur in any position.   All other systems are reviewed and negative.    PHYSICAL EXAM: VS:  BP 136/80   Pulse 78   Ht 5\' 3"  (1.6 m)   Wt 181 lb 3.2 oz (82.2 kg)   SpO2 91%   BMI 32.10 kg/m  , BMI Body mass index is 32.1 kg/m. GEN: Well nourished, well developed, in no acute distress, masked HEENT: normal Neck: no JVD, carotid bruits, or masses Cardiac: RRR; no murmurs, rubs, or gallops,no edema  Respiratory:  clear to auscultation bilaterally, normal work of breathing GI: soft, nontender, nondistended, + BS MS: no deformity or atrophy Skin: warm and dry, no rash Neuro:  Strength and sensation are intact Psych: euthymic mood, full affect   EKG:   The ekg ordered today demonstrates NSR, no ST changes in 4/22   Recent Labs: 01/05/2021: ALT 13; BUN 9; Creatinine, Ser 1.25; Hemoglobin 14.4; Platelets 244; Potassium 3.7; Sodium 135   Lipid Panel No results found for: CHOL, TRIG, HDL, CHOLHDL, VLDL, LDLCALC, LDLDIRECT   Other studies Reviewed: Additional studies/ records that were reviewed today with results demonstrating: Outside records reviewed.  Outside ECG.   ASSESSMENT AND PLAN:  Fluctuating blood pressure: Question from the primary care doctor as to whether this could be secondary to adrenal insufficiency in the setting of chronic steroid use versus a cardiac cause.  Will check echocardiogram to evaluate LV function.  Could also refer to her hypertension clinic to see about use of midodrine if low blood pressures continue to be a problem.  Question whether she as something similar to POTS, but her sx are not always while standing. THey can happen at any time.  We will check with our Pharm.D.'s as to whether any of her other medications could be responsible for her symptoms.  Also consider follow-up in the hypertension clinic. PreDM: A1C 6.2 in 01/2021.  Whole food, plant-based diet. Hyperlipidemia:  High-fiber intake will be helpful.  Continue atorvastatin.  Managed by primary care doctor.  LDL 96, TG 230 in Jan 2022.  Tobacco abuse: She needs to stop smoking.  Given family history, could also consider a calcium scoring CT scan for risk assessment.   Current medicines are reviewed at length with the patient today.  The patient concerns regarding her medicines were addressed.  The following changes have been made:  No change  Labs/ tests ordered today include:  No orders of the defined types were placed in this encounter.   Recommend 150 minutes/week of  aerobic exercise Low fat, low carb, high fiber diet recommended  Disposition:   FU in based on echo results   Signed, Lance Muss, MD  05/08/2021 1:24 PM    [facility] [address] [place], [place], [place]  [address] Phone: [phone]; Fax: [phone]

## 2021-05-28 ENCOUNTER — Other Ambulatory Visit: Payer: Self-pay

## 2021-05-28 ENCOUNTER — Ambulatory Visit (HOSPITAL_COMMUNITY): Payer: BC Managed Care – PPO | Attending: Internal Medicine

## 2021-05-28 DIAGNOSIS — I998 Other disorder of circulatory system: Secondary | ICD-10-CM | POA: Diagnosis not present

## 2021-05-28 LAB — ECHOCARDIOGRAM COMPLETE
Area-P 1/2: 3.27 cm2
S' Lateral: 2.9 cm

## 2021-05-30 ENCOUNTER — Telehealth: Payer: Self-pay | Admitting: *Deleted

## 2021-05-30 DIAGNOSIS — F4481 Dissociative identity disorder: Secondary | ICD-10-CM | POA: Diagnosis not present

## 2021-05-30 DIAGNOSIS — F3110 Bipolar disorder, current episode manic without psychotic features, unspecified: Secondary | ICD-10-CM | POA: Diagnosis not present

## 2021-05-30 DIAGNOSIS — F4310 Post-traumatic stress disorder, unspecified: Secondary | ICD-10-CM | POA: Diagnosis not present

## 2021-05-30 NOTE — Telephone Encounter (Signed)
Left message to call office

## 2021-05-30 NOTE — Telephone Encounter (Signed)
-----   Message from Corky Crafts, MD sent at 05/29/2021  1:52 PM EDT ----- Normal LV/RV function. Normal valvular function. No cardiac cause of BP issues noted.  For general CAD screening, could consider calcium scoring CT scan, but this is not related to her BP issues.

## 2021-06-04 NOTE — Telephone Encounter (Signed)
Left message to call office.  Result comments sent through my chart

## 2021-06-06 NOTE — Telephone Encounter (Signed)
Patient viewed result comments in my chart 

## 2021-06-07 NOTE — Telephone Encounter (Signed)
Reviewed with Dr Eldridge Dace and patient can follow up in PharmD hypertension clinic if she is still having issues with variable BP

## 2021-06-20 DIAGNOSIS — J45909 Unspecified asthma, uncomplicated: Secondary | ICD-10-CM | POA: Diagnosis not present

## 2021-06-20 DIAGNOSIS — R7303 Prediabetes: Secondary | ICD-10-CM | POA: Diagnosis not present

## 2021-06-20 DIAGNOSIS — K219 Gastro-esophageal reflux disease without esophagitis: Secondary | ICD-10-CM | POA: Diagnosis not present

## 2021-06-20 DIAGNOSIS — Z23 Encounter for immunization: Secondary | ICD-10-CM | POA: Diagnosis not present

## 2021-06-20 DIAGNOSIS — N1831 Chronic kidney disease, stage 3a: Secondary | ICD-10-CM | POA: Diagnosis not present

## 2021-06-20 DIAGNOSIS — Z0000 Encounter for general adult medical examination without abnormal findings: Secondary | ICD-10-CM | POA: Diagnosis not present

## 2021-06-20 DIAGNOSIS — E7800 Pure hypercholesterolemia, unspecified: Secondary | ICD-10-CM | POA: Diagnosis not present

## 2021-07-02 DIAGNOSIS — L4050 Arthropathic psoriasis, unspecified: Secondary | ICD-10-CM | POA: Diagnosis not present

## 2021-07-15 DIAGNOSIS — R609 Edema, unspecified: Secondary | ICD-10-CM | POA: Diagnosis not present

## 2021-08-12 DIAGNOSIS — I7300 Raynaud's syndrome without gangrene: Secondary | ICD-10-CM | POA: Diagnosis not present

## 2021-08-12 DIAGNOSIS — M1990 Unspecified osteoarthritis, unspecified site: Secondary | ICD-10-CM | POA: Diagnosis not present

## 2021-08-12 DIAGNOSIS — L4050 Arthropathic psoriasis, unspecified: Secondary | ICD-10-CM | POA: Diagnosis not present

## 2021-08-12 DIAGNOSIS — Z79899 Other long term (current) drug therapy: Secondary | ICD-10-CM | POA: Diagnosis not present

## 2021-08-13 DIAGNOSIS — F4310 Post-traumatic stress disorder, unspecified: Secondary | ICD-10-CM | POA: Diagnosis not present

## 2021-08-13 DIAGNOSIS — F3110 Bipolar disorder, current episode manic without psychotic features, unspecified: Secondary | ICD-10-CM | POA: Diagnosis not present

## 2021-08-13 DIAGNOSIS — F4481 Dissociative identity disorder: Secondary | ICD-10-CM | POA: Diagnosis not present

## 2021-08-27 DIAGNOSIS — L4050 Arthropathic psoriasis, unspecified: Secondary | ICD-10-CM | POA: Diagnosis not present

## 2021-09-10 DIAGNOSIS — L4050 Arthropathic psoriasis, unspecified: Secondary | ICD-10-CM | POA: Diagnosis not present

## 2021-09-24 DIAGNOSIS — L4050 Arthropathic psoriasis, unspecified: Secondary | ICD-10-CM | POA: Diagnosis not present

## 2021-10-22 DIAGNOSIS — L4050 Arthropathic psoriasis, unspecified: Secondary | ICD-10-CM | POA: Diagnosis not present

## 2021-10-27 IMAGING — CT CT CHEST W/ CM
2 of 3 series · 15 of 36 positions shown, 18 images · IV contrast (OMNIPAQUE 300)
Comparison: None.

CLINICAL DATA: Fever with nausea and vomiting

EXAM:
CT CHEST WITH CONTRAST
TECHNIQUE: Multidetector CT imaging of the chest was performed during
intravenous contrast administration.
CONTRAST:  75mL OMNIPAQUE IOHEXOL 300 MG/ML  SOLN

[Series 2: axial st · axial · 0.78mm/px · z∈[+710,+962]mm · 12 of 150 slices shown, 15 images]
[im 12/150  mediastinal]
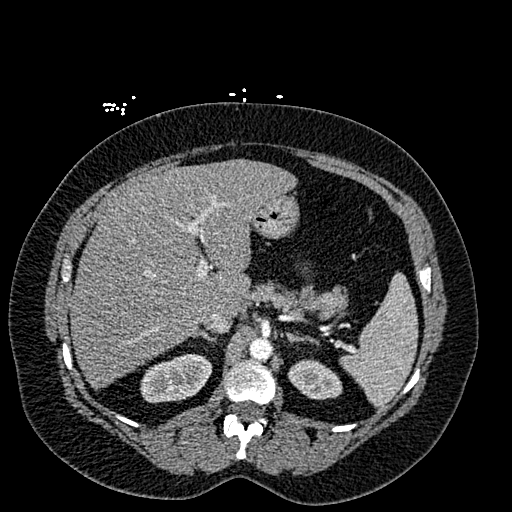
[im 12/150  lung]
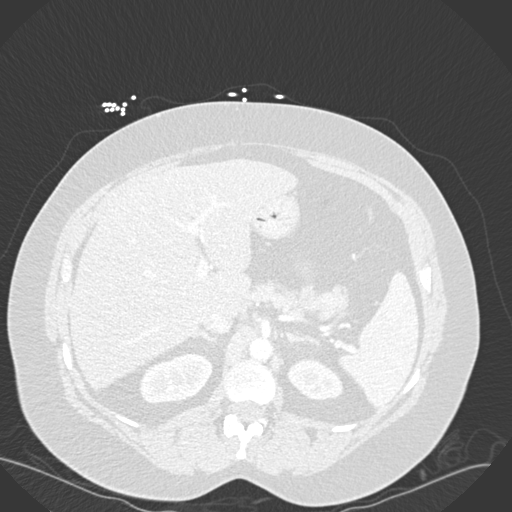
[im 23/150  lung]
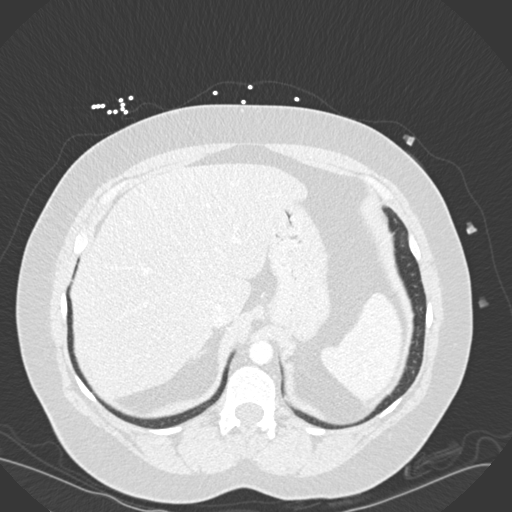
[im 34/150  lung]
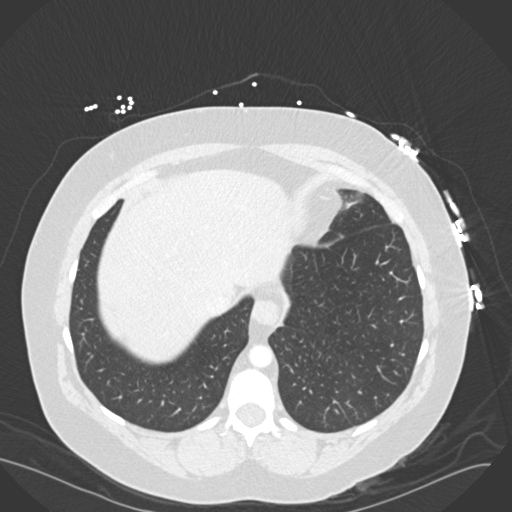
[im 45/150  lung]
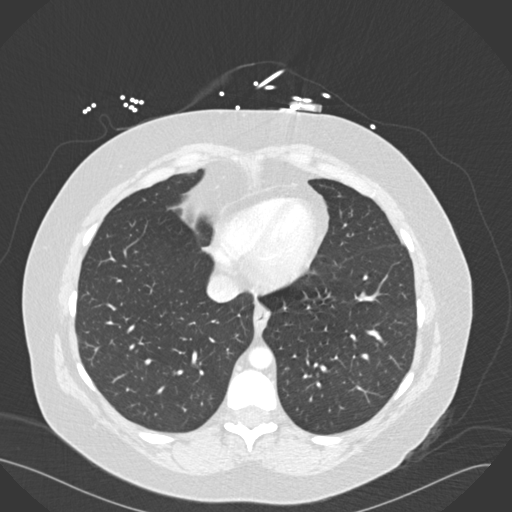
[im 56/150  mediastinal]
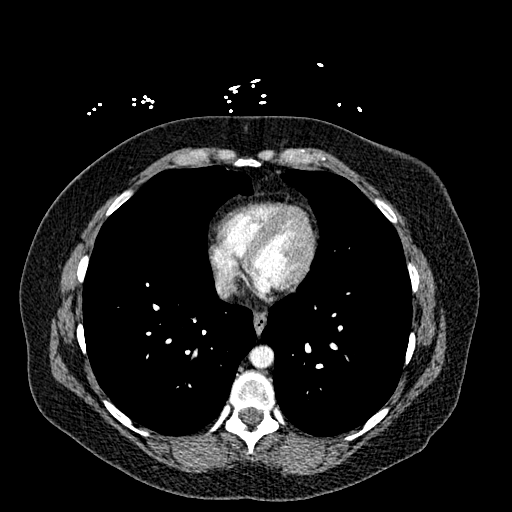
[im 56/150  lung]
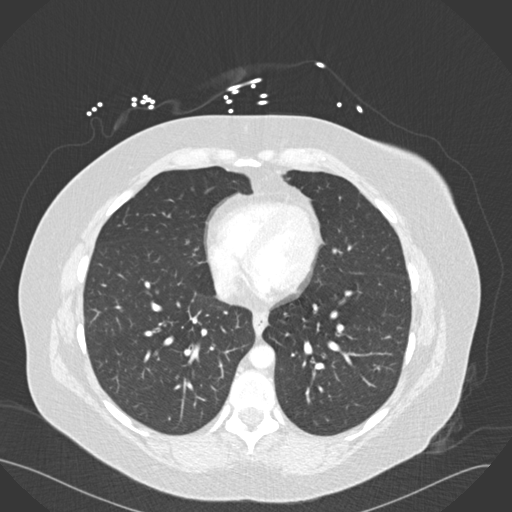
[im 67/150  lung]
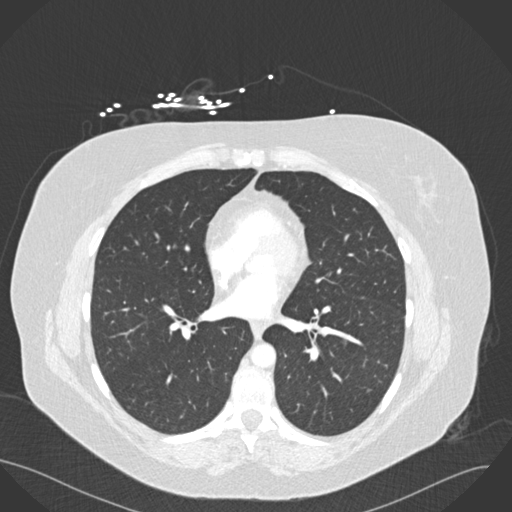
[im 83/150  lung]
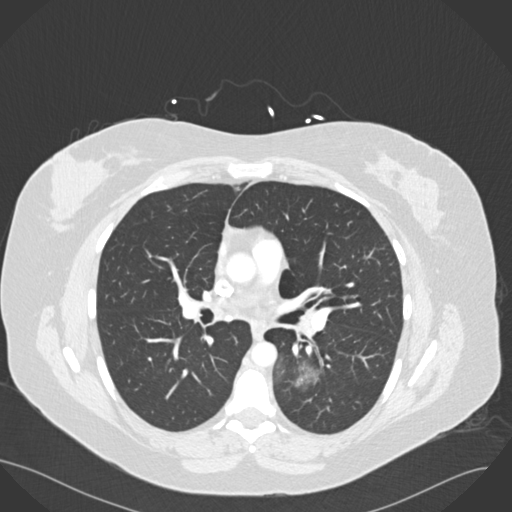
[im 94/150  lung]
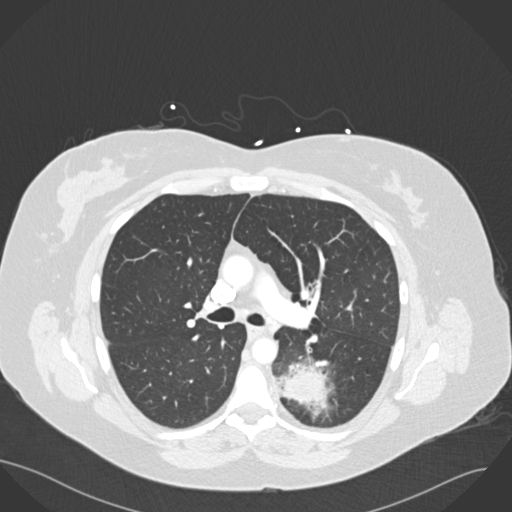
[im 105/150  mediastinal]
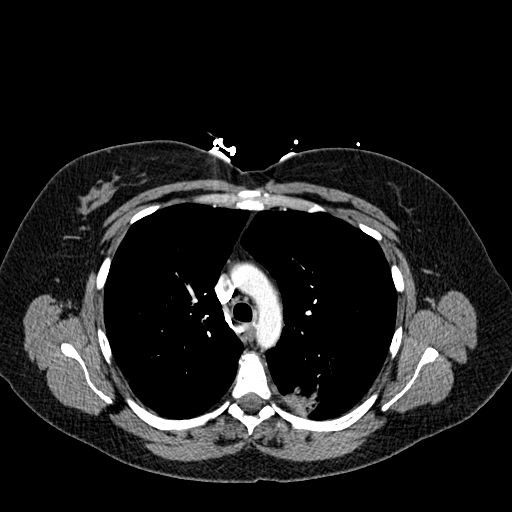
[im 105/150  lung]
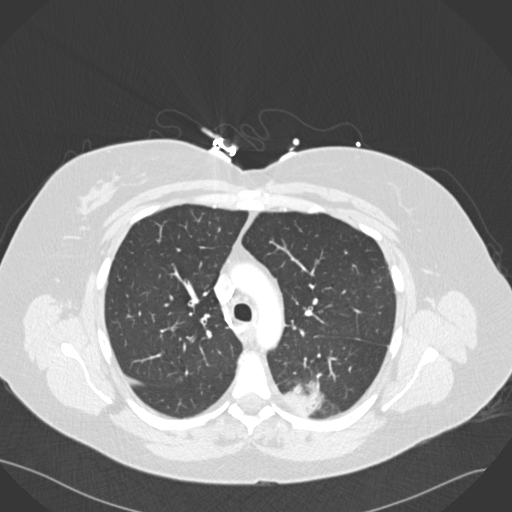
[im 116/150  lung]
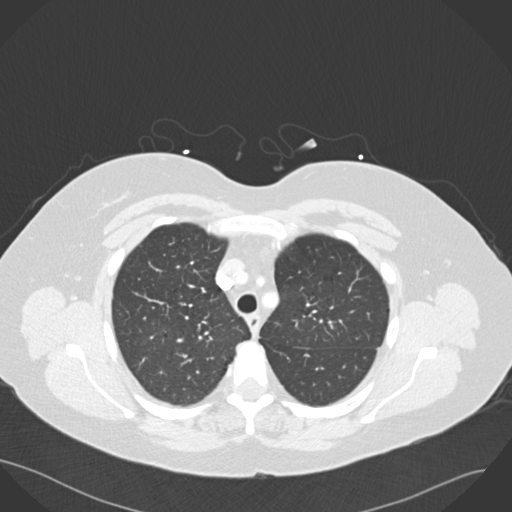
[im 127/150  lung]
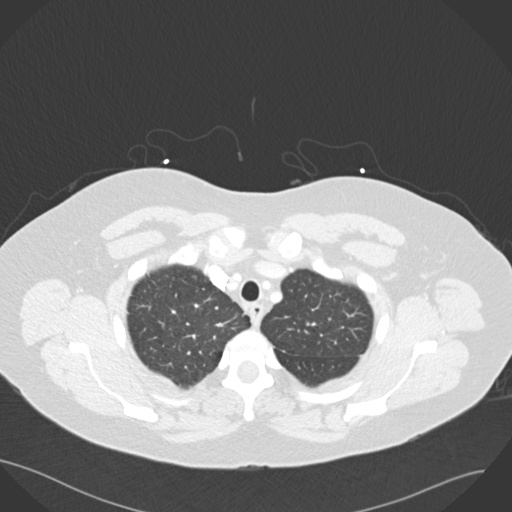
[im 138/150  lung]
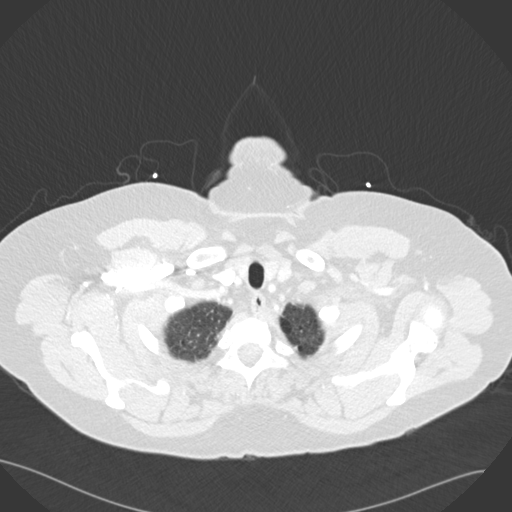

[Series 6: coronal · coronal · 0.64mm/px · 3 of 160 slices shown]
[im 32/160  lung]
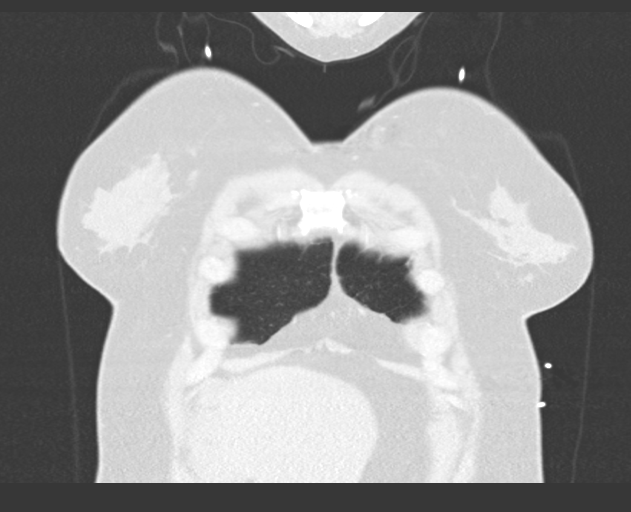
[im 64/160  lung]
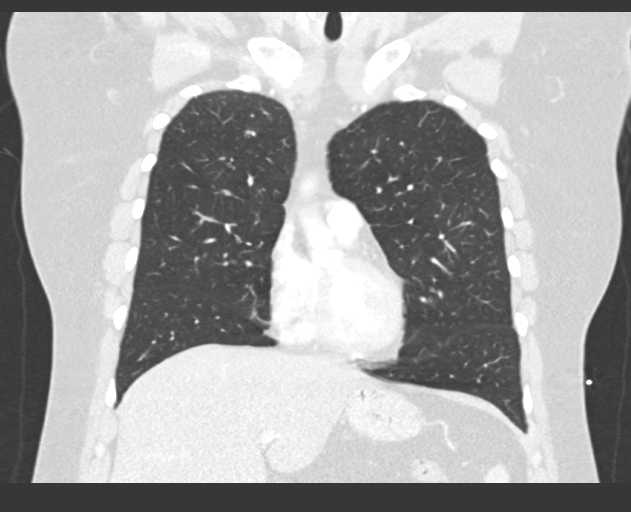
[im 96/160  lung]
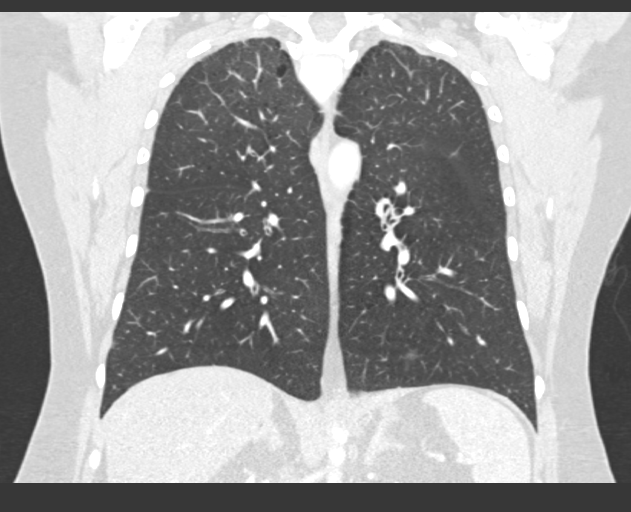

[15 of 36 positions shown; findings below may reference images not displayed]

FINDINGS: Cardiovascular: No significant vascular findings. Normal heart size.
No pericardial effusion.

Mediastinum/Nodes: No enlarged mediastinal, hilar, or axillary lymph
nodes. Thyroid gland, trachea, and esophagus demonstrate no
significant findings.

Lungs/Pleura: Area of consolidation measuring 3.8 x 3.4 cm. No
pleural effusion or pneumothorax. The lungs are otherwise clear.

Upper Abdomen: No acute abnormality.

Musculoskeletal: No chest wall abnormality. No acute or significant
osseous findings.
IMPRESSION: Area of consolidation in the right upper lobe, concerning for
pneumonia. Followup PA and lateral chest X-ray is recommended in 3-4
weeks following trial of antibiotic therapy to ensure resolution and
exclude underlying malignancy.

## 2021-11-11 DIAGNOSIS — I7300 Raynaud's syndrome without gangrene: Secondary | ICD-10-CM | POA: Diagnosis not present

## 2021-11-11 DIAGNOSIS — L4050 Arthropathic psoriasis, unspecified: Secondary | ICD-10-CM | POA: Diagnosis not present

## 2021-11-11 DIAGNOSIS — M1990 Unspecified osteoarthritis, unspecified site: Secondary | ICD-10-CM | POA: Diagnosis not present

## 2021-11-11 DIAGNOSIS — Z79899 Other long term (current) drug therapy: Secondary | ICD-10-CM | POA: Diagnosis not present

## 2021-11-11 DIAGNOSIS — M25511 Pain in right shoulder: Secondary | ICD-10-CM | POA: Diagnosis not present

## 2021-11-12 DIAGNOSIS — F3110 Bipolar disorder, current episode manic without psychotic features, unspecified: Secondary | ICD-10-CM | POA: Diagnosis not present

## 2021-11-12 DIAGNOSIS — F4310 Post-traumatic stress disorder, unspecified: Secondary | ICD-10-CM | POA: Diagnosis not present

## 2021-11-12 DIAGNOSIS — F4481 Dissociative identity disorder: Secondary | ICD-10-CM | POA: Diagnosis not present

## 2021-11-21 DIAGNOSIS — L4050 Arthropathic psoriasis, unspecified: Secondary | ICD-10-CM | POA: Diagnosis not present

## 2021-12-19 DIAGNOSIS — L4050 Arthropathic psoriasis, unspecified: Secondary | ICD-10-CM | POA: Diagnosis not present

## 2022-01-16 DIAGNOSIS — L4050 Arthropathic psoriasis, unspecified: Secondary | ICD-10-CM | POA: Diagnosis not present

## 2022-02-05 DIAGNOSIS — F3110 Bipolar disorder, current episode manic without psychotic features, unspecified: Secondary | ICD-10-CM | POA: Diagnosis not present

## 2022-02-05 DIAGNOSIS — F4310 Post-traumatic stress disorder, unspecified: Secondary | ICD-10-CM | POA: Diagnosis not present

## 2022-02-05 DIAGNOSIS — F4481 Dissociative identity disorder: Secondary | ICD-10-CM | POA: Diagnosis not present

## 2022-02-11 DIAGNOSIS — M1990 Unspecified osteoarthritis, unspecified site: Secondary | ICD-10-CM | POA: Diagnosis not present

## 2022-02-11 DIAGNOSIS — Z79899 Other long term (current) drug therapy: Secondary | ICD-10-CM | POA: Diagnosis not present

## 2022-02-11 DIAGNOSIS — L4050 Arthropathic psoriasis, unspecified: Secondary | ICD-10-CM | POA: Diagnosis not present

## 2022-02-11 DIAGNOSIS — I7300 Raynaud's syndrome without gangrene: Secondary | ICD-10-CM | POA: Diagnosis not present

## 2022-04-08 DIAGNOSIS — U071 COVID-19: Secondary | ICD-10-CM | POA: Diagnosis not present

## 2022-05-08 DIAGNOSIS — F3110 Bipolar disorder, current episode manic without psychotic features, unspecified: Secondary | ICD-10-CM | POA: Diagnosis not present

## 2022-05-08 DIAGNOSIS — F4481 Dissociative identity disorder: Secondary | ICD-10-CM | POA: Diagnosis not present

## 2022-05-08 DIAGNOSIS — F4310 Post-traumatic stress disorder, unspecified: Secondary | ICD-10-CM | POA: Diagnosis not present

## 2022-05-14 DIAGNOSIS — L4050 Arthropathic psoriasis, unspecified: Secondary | ICD-10-CM | POA: Diagnosis not present

## 2022-05-14 DIAGNOSIS — I7300 Raynaud's syndrome without gangrene: Secondary | ICD-10-CM | POA: Diagnosis not present

## 2022-05-14 DIAGNOSIS — Z13820 Encounter for screening for osteoporosis: Secondary | ICD-10-CM | POA: Diagnosis not present

## 2022-05-14 DIAGNOSIS — Z79899 Other long term (current) drug therapy: Secondary | ICD-10-CM | POA: Diagnosis not present

## 2022-05-14 DIAGNOSIS — M1990 Unspecified osteoarthritis, unspecified site: Secondary | ICD-10-CM | POA: Diagnosis not present

## 2022-05-16 DIAGNOSIS — L4050 Arthropathic psoriasis, unspecified: Secondary | ICD-10-CM | POA: Diagnosis not present

## 2022-06-16 DIAGNOSIS — I7300 Raynaud's syndrome without gangrene: Secondary | ICD-10-CM | POA: Diagnosis not present

## 2022-06-16 DIAGNOSIS — Z79899 Other long term (current) drug therapy: Secondary | ICD-10-CM | POA: Diagnosis not present

## 2022-06-16 DIAGNOSIS — M1990 Unspecified osteoarthritis, unspecified site: Secondary | ICD-10-CM | POA: Diagnosis not present

## 2022-06-16 DIAGNOSIS — L4050 Arthropathic psoriasis, unspecified: Secondary | ICD-10-CM | POA: Diagnosis not present

## 2022-07-03 DIAGNOSIS — E7800 Pure hypercholesterolemia, unspecified: Secondary | ICD-10-CM | POA: Diagnosis not present

## 2022-07-03 DIAGNOSIS — N1831 Chronic kidney disease, stage 3a: Secondary | ICD-10-CM | POA: Diagnosis not present

## 2022-07-03 DIAGNOSIS — F319 Bipolar disorder, unspecified: Secondary | ICD-10-CM | POA: Diagnosis not present

## 2022-07-03 DIAGNOSIS — Z0000 Encounter for general adult medical examination without abnormal findings: Secondary | ICD-10-CM | POA: Diagnosis not present

## 2022-07-03 DIAGNOSIS — R7303 Prediabetes: Secondary | ICD-10-CM | POA: Diagnosis not present

## 2022-07-17 DIAGNOSIS — F4481 Dissociative identity disorder: Secondary | ICD-10-CM | POA: Diagnosis not present

## 2022-07-17 DIAGNOSIS — F4310 Post-traumatic stress disorder, unspecified: Secondary | ICD-10-CM | POA: Diagnosis not present

## 2022-07-17 DIAGNOSIS — F3110 Bipolar disorder, current episode manic without psychotic features, unspecified: Secondary | ICD-10-CM | POA: Diagnosis not present

## 2022-09-11 DIAGNOSIS — Z79899 Other long term (current) drug therapy: Secondary | ICD-10-CM | POA: Diagnosis not present

## 2022-09-11 DIAGNOSIS — M1990 Unspecified osteoarthritis, unspecified site: Secondary | ICD-10-CM | POA: Diagnosis not present

## 2022-09-11 DIAGNOSIS — I7300 Raynaud's syndrome without gangrene: Secondary | ICD-10-CM | POA: Diagnosis not present

## 2022-09-11 DIAGNOSIS — L4050 Arthropathic psoriasis, unspecified: Secondary | ICD-10-CM | POA: Diagnosis not present

## 2022-09-18 DIAGNOSIS — Z1211 Encounter for screening for malignant neoplasm of colon: Secondary | ICD-10-CM | POA: Diagnosis not present

## 2022-10-03 DIAGNOSIS — E7800 Pure hypercholesterolemia, unspecified: Secondary | ICD-10-CM | POA: Diagnosis not present

## 2022-10-15 DIAGNOSIS — L821 Other seborrheic keratosis: Secondary | ICD-10-CM | POA: Diagnosis not present

## 2022-10-15 DIAGNOSIS — L280 Lichen simplex chronicus: Secondary | ICD-10-CM | POA: Diagnosis not present

## 2022-11-03 ENCOUNTER — Ambulatory Visit: Payer: BC Managed Care – PPO | Admitting: Neurology

## 2022-11-03 ENCOUNTER — Encounter: Payer: Self-pay | Admitting: Neurology

## 2022-11-03 VITALS — BP 112/70 | HR 74 | Ht 63.0 in | Wt 195.0 lb

## 2022-11-03 DIAGNOSIS — F319 Bipolar disorder, unspecified: Secondary | ICD-10-CM | POA: Insufficient documentation

## 2022-11-03 DIAGNOSIS — H539 Unspecified visual disturbance: Secondary | ICD-10-CM

## 2022-11-03 DIAGNOSIS — R202 Paresthesia of skin: Secondary | ICD-10-CM | POA: Diagnosis not present

## 2022-11-03 DIAGNOSIS — M797 Fibromyalgia: Secondary | ICD-10-CM | POA: Insufficient documentation

## 2022-11-03 DIAGNOSIS — K219 Gastro-esophageal reflux disease without esophagitis: Secondary | ICD-10-CM | POA: Insufficient documentation

## 2022-11-03 DIAGNOSIS — J45909 Unspecified asthma, uncomplicated: Secondary | ICD-10-CM | POA: Insufficient documentation

## 2022-11-03 DIAGNOSIS — N189 Chronic kidney disease, unspecified: Secondary | ICD-10-CM | POA: Insufficient documentation

## 2022-11-03 DIAGNOSIS — E7800 Pure hypercholesterolemia, unspecified: Secondary | ICD-10-CM | POA: Insufficient documentation

## 2022-11-03 DIAGNOSIS — M1990 Unspecified osteoarthritis, unspecified site: Secondary | ICD-10-CM | POA: Insufficient documentation

## 2022-11-03 DIAGNOSIS — R0602 Shortness of breath: Secondary | ICD-10-CM | POA: Insufficient documentation

## 2022-11-03 DIAGNOSIS — L4050 Arthropathic psoriasis, unspecified: Secondary | ICD-10-CM | POA: Insufficient documentation

## 2022-11-03 DIAGNOSIS — R269 Unspecified abnormalities of gait and mobility: Secondary | ICD-10-CM

## 2022-11-03 DIAGNOSIS — I7300 Raynaud's syndrome without gangrene: Secondary | ICD-10-CM | POA: Insufficient documentation

## 2022-11-03 DIAGNOSIS — I998 Other disorder of circulatory system: Secondary | ICD-10-CM | POA: Insufficient documentation

## 2022-11-03 MED ORDER — GABAPENTIN 300 MG PO CAPS: 300.0000 mg | ORAL_CAPSULE | Freq: Four times a day (QID) | ORAL | 11 refills | Status: AC

## 2022-11-03 NOTE — Progress Notes (Addendum)
Chief Complaint  Patient presents with   New Patient (Initial Visit)    NP Paper proficient referral for pain, tingling and numbness States pain in worst in legs      ASSESSMENT AND PLAN  Cynthia Ramos is a 52 y.o. female   Rapid progression of painful bilateral lower extremity paresthesia, distal weakness,  In the setting of connective tissue disorder, mild distal weakness, brisk upper extremity patellar reflex,  MRI of cervical spine to rule out spondylitic myelopathy  EMG nerve conduction study to evaluate peripheral neuropathy Decreased visual field,  MRI of the brain with without contrast  Refer to ophthalmology  DIAGNOSTIC DATA (LABS, IMAGING, TESTING) - I reviewed patient records, labs, notes, testing and imaging myself where available.   MEDICAL HISTORY:  Cynthia Ramos is a 52 year old female, accompanied by her husband, seen in request by her rheumatologist [facility] Dr. Deanne Coffer, Stefano Gaul, for evaluation of bilateral feet paresthesia, initial evaluation was on November 03, 2022    I reviewed and summarized the referring note. PMHX. Psoriatic arthritis Raynaud  Bipolar disease. Chronic insomnia Diffuse body achy pain,  Smoke 1ppd    She had a history of psoriatic arthritis, Raynaud's phenomenon, multiple joints pain,  She has been treated with prednisone over the past couple years, was on 25 mg daily, now tapered down to 5 mg 1 and half tablets daily, family history of lupus, psoriasis, currently taking Skyrizi, also taking tramadol for chronic multiple joints pain, also polypharmacy for her bipolar disorder, chronic insomnia, restless leg syndrome  Laboratory evaluation December 2023, hemoglobin of 12.9, creatinine 1.04, otherwise normal CMP, ESR of 5  Since early 2023, she began to notice bilateral feet paresthesia, initially only intermittent, symmetric, involving plantar surface, numbing sensation, quickly progressed to current burning  intermittent shooting pain, she cannot feel the floor, but as if walk on the piece of glass, with intermittent sharp radiating pain,  She complains of gait abnormality, chronic neck and low back pain, denies bilateral fingertips paresthesia denies bowel or bladder incontinence  She is taking gabapentin 100 mg up to 3 tablets every night for her restless leg symptoms, with only mild improvement, urged to move her leg as soon as her lying down, difficulty falling to sleep, despite taking Seroquel 300 mg every night  During my examination, she was found to have significantly constricted visual fields, patient reported this has been present for many years, was seen by ophthalmologist years ago, lost follow-up since 2022, she did notice a rapid worsening of her visual field constriction, she tripped on her dog sometimes when they were not in her visual field,  PHYSICAL EXAM:   Vitals:   11/03/22 1101  BP: 112/70  Pulse: 74  Weight: 195 lb (88.5 kg)  Height: 5\' 3"  (1.6 m)   Not recorded     Body mass index is 34.54 kg/m.  PHYSICAL EXAMNIATION:  Gen: NAD, conversant, well nourised, well groomed                     Cardiovascular: Regular rate rhythm, no peripheral edema, warm, nontender. Eyes: Conjunctivae clear without exudates or hemorrhage Neck: Supple, no carotid bruits. Pulmonary: Clear to auscultation bilaterally   NEUROLOGICAL EXAM:  MENTAL STATUS: Speech/cognition: Anxious looking middle-age female, awake, alert, oriented to history taking and casual conversation CRANIAL NERVES: CN II: Pupils are round equal and briskly reactive to light.  Funduscopy examination showed sharp disc bilaterally, significantly constricted bilateral visual field CN III, IV, VI:  extraocular movement are normal. No ptosis. CN V: Facial sensation is intact to light touch CN VII: Face is symmetric with normal eye closure  CN VIII: Hearing is normal to causal conversation. CN IX, X: Phonation is  normal. CN XI: Head turning and shoulder shrug are intact  MOTOR: Dry scaly bilateral foot skin, right lateral ankle Psoriatec lesion, mild bilateral toe flexion extension weakness  REFLEXES: Reflexes are 3 and symmetric at the biceps, triceps, knees, and absent at ankles. Plantar responses are flexor.  SENSORY: Intact to light touch, pinprick and vibratory sensation are intact in fingers and toes.  COORDINATION: There is no trunk or limb dysmetria noted.  GAIT/STANCE: Need push-up to get up from seated position, antalgic, unsteady  REVIEW OF SYSTEMS:  Full 14 system review of systems performed and notable only for as above All other review of systems were negative.   ALLERGIES: Allergies  Allergen Reactions   Meloxicam Anaphylaxis   Methotrexate Derivatives Anaphylaxis   Aspirin     Per patient bleeds out through skin   Penicillin G Other (See Comments)   Sulfa Antibiotics Other (See Comments)    HOME MEDICATIONS: Current Outpatient Medications  Medication Sig Dispense Refill   acetaminophen (TYLENOL) 325 MG tablet Take 650 mg by mouth every 6 (six) hours as needed.     albuterol (VENTOLIN HFA) 108 (90 Base) MCG/ACT inhaler Inhale 2 puffs into the lungs every 4 (four) hours as needed.     atorvastatin (LIPITOR) 20 MG tablet Take 1 tablet by mouth daily.     augmented betamethasone dipropionate (DIPROLENE-AF) 0.05 % cream Apply topically.     EPINEPHrine 0.15 MG/0.15ML IJ injection Inject 0.15 mg into the muscle as needed for anaphylaxis.     escitalopram (LEXAPRO) 20 MG tablet Take 20 mg by mouth in the morning and at bedtime.     esomeprazole (NEXIUM) 40 MG capsule Take 40 mg by mouth daily.     folic acid (FOLVITE) 1 MG tablet Take 1 mg by mouth daily.     gabapentin (NEURONTIN) 100 MG capsule Take 100 mg by mouth as needed. Per patient taking 200 mg     predniSONE (DELTASONE) 5 MG tablet Take 5 mg by mouth daily with breakfast. Per patient taking 1 1/2 tablet daily      QUEtiapine (SEROQUEL XR) 300 MG 24 hr tablet Take 1 tablet by mouth every evening.     SKYRIZI 150 MG/ML SOSY Inject into the skin.     traMADol (ULTRAM) 50 MG tablet Take 50 mg by mouth daily as needed.     No current facility-administered medications for this visit.    PAST MEDICAL HISTORY: Past Medical History:  Diagnosis Date   Arthritis    Bipolar disorder (HCC)    Bipolar disorder, unspecified (HCC)    CKD (chronic kidney disease), stage III (HCC)    GERD (gastroesophageal reflux disease)    High cholesterol    Prediabetes    Reactive airway disease    Tobacco dependency     PAST SURGICAL HISTORY: Past Surgical History:  Procedure Laterality Date   GALLBLADDER SURGERY Right    OVARIAN CYST REMOVAL     TUBAL LIGATION      FAMILY HISTORY: Family History  Problem Relation Age of Onset   Heart attack Brother     SOCIAL HISTORY: Social History   Socioeconomic History   Marital status: Married    Spouse name: Not on file   Number of children: Not on file  Years of education: Not on file   Highest education level: Not on file  Occupational History   Not on file  Tobacco Use   Smoking status: Every Day    Packs/day: 1.00    Years: 30.00    Total pack years: 30.00    Types: Cigarettes   Smokeless tobacco: Never  Vaping Use   Vaping Use: Never used  Substance and Sexual Activity   Alcohol use: Never   Drug use: Never   Sexual activity: Not on file  Other Topics Concern   Not on file  Social History Narrative   Not on file   Social Determinants of Health   Financial Resource Strain: Not on file  Food Insecurity: Not on file  Transportation Needs: Not on file  Physical Activity: Not on file  Stress: Not on file  Social Connections: Not on file  Intimate Partner Violence: Not on file      Levert Feinstein, M.D. Ph.D.  [facility] [address] [place], [place] [address] Ph: [phone] Fax: [phone]  CC:   Casimer Lanius, MD [address] [place],  [place] [address]  Milus Height, [place]

## 2022-11-05 ENCOUNTER — Ambulatory Visit: Payer: BC Managed Care – PPO | Admitting: Neurology

## 2022-11-05 ENCOUNTER — Encounter: Payer: BC Managed Care – PPO | Admitting: Neurology

## 2022-11-05 DIAGNOSIS — R269 Unspecified abnormalities of gait and mobility: Secondary | ICD-10-CM

## 2022-11-05 DIAGNOSIS — R202 Paresthesia of skin: Secondary | ICD-10-CM

## 2022-11-05 DIAGNOSIS — H539 Unspecified visual disturbance: Secondary | ICD-10-CM

## 2022-11-05 NOTE — Procedures (Signed)
Full Name: Cynthia Ramos Gender: Female MRN #: [id number] Date of Birth: [birth year]    Visit Date: 11/05/2022 10:47 Age: 52 Years Examining Physician: Dr. Marcial Pacas Referring Physician: Dr. Marcial Pacas Height: 5 feet 3 inch History: 52 years old female with history of connective tissue disorder complains couple years history of paresthesia involving toes,  Summary of the test:  Nerve conduction study:  Bilateral sural, superficial peroneal sensory responses were normal.  Right median and ulnar sensory and motor responses were normal.  Bilateral tibial, peroneal to EDB motor responses were normal.  Bilateral H-reflexes were present and symmetric.  Electromyography: Selected needle examination of left lower extremity muscles, left lumbosacral paraspinal muscles; right upper extremity muscles were normal.   Conclusion: This is a normal study.  There is no electrodiagnostic evidence of large fiber peripheral neuropathy, or focal neuropathy.    ------------------------------- Marcial Pacas, M.D. PhD  [facility] [address] [place], [place] [address] Tel: [phone] Fax: [phone]  Verbal informed consent was obtained from the patient, patient was informed of potential risk of procedure, including bruising, bleeding, hematoma formation, infection, muscle weakness, muscle pain, numbness, among others.        [place]    Nerve / Sites Muscle Latency Ref. Amplitude Ref. Rel Amp Segments Distance Velocity Ref. Area    ms ms mV mV %  cm m/s m/s mVms  R Median - APB     Wrist APB 3.5 ?4.4 11.4 ?4.0 100 Wrist - APB 7   36.3     Upper arm APB 7.8  9.3  81.8 Upper arm - Wrist 23 53 ?49 30.0  R Ulnar - ADM     Wrist ADM 2.6 ?3.3 11.8 ?6.0 100 Wrist - ADM 7   40.8     B.Elbow ADM 4.6  11.8  99.5 B.Elbow - Wrist 10.5 54 ?49 39.4     A.Elbow ADM 7.0  10.7  91.1 A.Elbow - B.Elbow 14 57 ?49 38.0  R Peroneal - EDB     Ankle EDB 6.3 ?6.5 4.4 ?2.0 100  Ankle - EDB 9   18.8     Fib head EDB 11.9  3.7  84.2 Fib head - Ankle 24.5 43 ?44 17.7     Pop fossa EDB 15.0  3.8  102 Pop fossa - Fib head 13 42 ?44 18.2         Pop fossa - Ankle      L Peroneal - EDB     Ankle EDB 6.4 ?6.5 4.2 ?2.0 100 Ankle - EDB 9   15.9     Fib head EDB 11.9  3.3  78 Fib head - Ankle 24 43 ?44 12.6     Pop fossa EDB 14.8  3.6  109 Pop fossa - Fib head 14 49 ?44 15.1         Pop fossa - Ankle      R Tibial - AH     Ankle AH 4.0 ?5.8 11.7 ?4.0 100 Ankle - AH 9   33.2     Pop fossa AH 14.3  10.5  90 Pop fossa - Ankle 41 40 ?41 32.4  L Tibial - AH     Ankle AH 4.2 ?5.8 8.3 ?4.0 100 Ankle - AH 9   21.5     Pop fossa AH 13.7  4.3  52.1 Pop fossa - Ankle 39 41 ?41 15.3  Magazine    Nerve / Sites Rec. Site Peak Lat Ref.  Amp Ref. Segments Distance    ms ms V V  cm  R Sural - Ankle (Calf)     Calf Ankle 3.8 ?4.4 8 ?6 Calf - Ankle 14  L Sural - Ankle (Calf)     Calf Ankle 3.7 ?4.4 7 ?6 Calf - Ankle 14  R Superficial peroneal - Ankle     Lat leg Ankle 4.2 ?4.4 8 ?6 Lat leg - Ankle 14  L Superficial peroneal - Ankle     Lat leg Ankle 4.3 ?4.4 8 ?6 Lat leg - Ankle 14  R Median - Orthodromic (Dig II, Mid palm)     Dig II Wrist 3.0 ?3.4 10 ?10 Dig II - Wrist 13  R Ulnar - Orthodromic, (Dig V, Mid palm)     Dig V Wrist 2.8 ?3.1 8 ?5 Dig V - Wrist 68                 F  Wave    Nerve F Lat Ref.   ms ms  R Ulnar - ADM 27.2 ?32.0  R Tibial - AH 55.4 ?56.0         H Reflex    Nerve H Lat Lat Hmax   ms ms   Left Right Ref. Left Right Ref.  Tibial - [place] 37.9 40.9 ?35.0 33.3 26.2 ?35.0         EMG Summary Table    Spontaneous MUAP Recruitment  Muscle IA Fib PSW Fasc Other Amp Dur. Poly Pattern  L. Tibialis anterior Normal None None None _______ Normal Normal Normal Normal  L. Tibialis posterior Normal None None None _______ Normal Normal Normal Normal  L. Peroneus longus Normal None None None _______ Normal Normal Normal Normal  L. Gastrocnemius  (Medial head) Normal None None None _______ Normal Normal Normal Normal  L. Vastus lateralis Normal None None None _______ Normal Normal Normal Normal  L. Lumbar paraspinals (low) Normal None None None _______ Normal Normal Normal Normal  L. Lumbar paraspinals (mid) Normal None None None _______ Normal Normal Normal Normal  R. First dorsal interosseous Normal None None None _______ Normal Normal Normal Normal  R. Pronator teres Normal None None None _______ Normal Normal Normal Normal  R. Biceps brachii Normal None None None _______ Normal Normal Normal Normal  R. Deltoid Normal None None None _______ Normal Normal Normal Normal  R. Triceps brachii Normal None None None _______ Normal Normal Normal Normal

## 2022-11-12 ENCOUNTER — Ambulatory Visit: Payer: BC Managed Care – PPO

## 2022-11-12 DIAGNOSIS — H539 Unspecified visual disturbance: Secondary | ICD-10-CM | POA: Diagnosis not present

## 2022-11-12 DIAGNOSIS — R269 Unspecified abnormalities of gait and mobility: Secondary | ICD-10-CM

## 2022-11-12 DIAGNOSIS — R202 Paresthesia of skin: Secondary | ICD-10-CM | POA: Diagnosis not present

## 2022-11-12 MED ORDER — GADOBENATE DIMEGLUMINE 529 MG/ML IV SOLN
20.0000 mL | Freq: Once | INTRAVENOUS | Status: AC | PRN
  Administered 2022-11-12: 20 mL via INTRAVENOUS

## 2022-12-16 DIAGNOSIS — Z79899 Other long term (current) drug therapy: Secondary | ICD-10-CM | POA: Diagnosis not present

## 2022-12-16 DIAGNOSIS — I7300 Raynaud's syndrome without gangrene: Secondary | ICD-10-CM | POA: Diagnosis not present

## 2022-12-16 DIAGNOSIS — M1990 Unspecified osteoarthritis, unspecified site: Secondary | ICD-10-CM | POA: Diagnosis not present

## 2022-12-16 DIAGNOSIS — E559 Vitamin D deficiency, unspecified: Secondary | ICD-10-CM | POA: Diagnosis not present

## 2022-12-16 DIAGNOSIS — L4050 Arthropathic psoriasis, unspecified: Secondary | ICD-10-CM | POA: Diagnosis not present

## 2023-01-13 ENCOUNTER — Telehealth: Payer: Self-pay | Admitting: Neurology

## 2023-01-13 NOTE — Telephone Encounter (Signed)
LVM and sent mychart msg informing pt of need to reschedule 02/05/23 appt - MD out

## 2023-02-05 ENCOUNTER — Ambulatory Visit: Payer: BC Managed Care – PPO | Admitting: Neurology

## 2023-03-09 ENCOUNTER — Ambulatory Visit: Payer: BC Managed Care – PPO | Admitting: Neurology

## 2023-03-25 DIAGNOSIS — M1990 Unspecified osteoarthritis, unspecified site: Secondary | ICD-10-CM | POA: Diagnosis not present

## 2023-03-25 DIAGNOSIS — Z79899 Other long term (current) drug therapy: Secondary | ICD-10-CM | POA: Diagnosis not present

## 2023-03-25 DIAGNOSIS — L4050 Arthropathic psoriasis, unspecified: Secondary | ICD-10-CM | POA: Diagnosis not present

## 2023-03-25 DIAGNOSIS — I7300 Raynaud's syndrome without gangrene: Secondary | ICD-10-CM | POA: Diagnosis not present

## 2023-03-25 DIAGNOSIS — E559 Vitamin D deficiency, unspecified: Secondary | ICD-10-CM | POA: Diagnosis not present

## 2023-04-29 DIAGNOSIS — F4310 Post-traumatic stress disorder, unspecified: Secondary | ICD-10-CM | POA: Diagnosis not present

## 2023-04-29 DIAGNOSIS — F4481 Dissociative identity disorder: Secondary | ICD-10-CM | POA: Diagnosis not present

## 2023-04-29 DIAGNOSIS — F3110 Bipolar disorder, current episode manic without psychotic features, unspecified: Secondary | ICD-10-CM | POA: Diagnosis not present

## 2023-06-08 ENCOUNTER — Telehealth: Payer: Self-pay | Admitting: Neurology

## 2023-06-08 NOTE — Telephone Encounter (Signed)
Pt cancelled appt due to mother being in the hospital.

## 2023-06-09 ENCOUNTER — Ambulatory Visit: Payer: BC Managed Care – PPO | Admitting: Neurology

## 2023-06-25 DIAGNOSIS — L4050 Arthropathic psoriasis, unspecified: Secondary | ICD-10-CM | POA: Diagnosis not present

## 2023-06-25 DIAGNOSIS — I7300 Raynaud's syndrome without gangrene: Secondary | ICD-10-CM | POA: Diagnosis not present

## 2023-06-25 DIAGNOSIS — M25551 Pain in right hip: Secondary | ICD-10-CM | POA: Diagnosis not present

## 2023-06-25 DIAGNOSIS — M1990 Unspecified osteoarthritis, unspecified site: Secondary | ICD-10-CM | POA: Diagnosis not present

## 2023-06-25 DIAGNOSIS — Z79899 Other long term (current) drug therapy: Secondary | ICD-10-CM | POA: Diagnosis not present

## 2023-06-25 DIAGNOSIS — E559 Vitamin D deficiency, unspecified: Secondary | ICD-10-CM | POA: Diagnosis not present

## 2023-07-14 DIAGNOSIS — E7800 Pure hypercholesterolemia, unspecified: Secondary | ICD-10-CM | POA: Diagnosis not present

## 2023-07-14 DIAGNOSIS — Z0000 Encounter for general adult medical examination without abnormal findings: Secondary | ICD-10-CM | POA: Diagnosis not present

## 2023-07-14 DIAGNOSIS — R7303 Prediabetes: Secondary | ICD-10-CM | POA: Diagnosis not present

## 2023-07-14 DIAGNOSIS — N1831 Chronic kidney disease, stage 3a: Secondary | ICD-10-CM | POA: Diagnosis not present

## 2023-07-14 DIAGNOSIS — J45909 Unspecified asthma, uncomplicated: Secondary | ICD-10-CM | POA: Diagnosis not present

## 2023-07-14 DIAGNOSIS — Z23 Encounter for immunization: Secondary | ICD-10-CM | POA: Diagnosis not present

## 2023-07-14 DIAGNOSIS — F319 Bipolar disorder, unspecified: Secondary | ICD-10-CM | POA: Diagnosis not present

## 2023-07-23 DIAGNOSIS — F4481 Dissociative identity disorder: Secondary | ICD-10-CM | POA: Diagnosis not present

## 2023-07-23 DIAGNOSIS — F3110 Bipolar disorder, current episode manic without psychotic features, unspecified: Secondary | ICD-10-CM | POA: Diagnosis not present

## 2023-07-23 DIAGNOSIS — F4310 Post-traumatic stress disorder, unspecified: Secondary | ICD-10-CM | POA: Diagnosis not present

## 2023-10-06 DIAGNOSIS — F3110 Bipolar disorder, current episode manic without psychotic features, unspecified: Secondary | ICD-10-CM | POA: Diagnosis not present

## 2023-10-06 DIAGNOSIS — F4310 Post-traumatic stress disorder, unspecified: Secondary | ICD-10-CM | POA: Diagnosis not present

## 2023-10-06 DIAGNOSIS — F4481 Dissociative identity disorder: Secondary | ICD-10-CM | POA: Diagnosis not present

## 2023-10-16 DIAGNOSIS — Z5181 Encounter for therapeutic drug level monitoring: Secondary | ICD-10-CM | POA: Diagnosis not present

## 2023-10-16 DIAGNOSIS — E7800 Pure hypercholesterolemia, unspecified: Secondary | ICD-10-CM | POA: Diagnosis not present

## 2023-10-26 DIAGNOSIS — Z79899 Other long term (current) drug therapy: Secondary | ICD-10-CM | POA: Diagnosis not present

## 2023-10-26 DIAGNOSIS — I7300 Raynaud's syndrome without gangrene: Secondary | ICD-10-CM | POA: Diagnosis not present

## 2023-10-26 DIAGNOSIS — L4050 Arthropathic psoriasis, unspecified: Secondary | ICD-10-CM | POA: Diagnosis not present

## 2023-10-26 DIAGNOSIS — M1990 Unspecified osteoarthritis, unspecified site: Secondary | ICD-10-CM | POA: Diagnosis not present

## 2023-11-10 DIAGNOSIS — M19011 Primary osteoarthritis, right shoulder: Secondary | ICD-10-CM | POA: Insufficient documentation

## 2023-11-10 DIAGNOSIS — M25511 Pain in right shoulder: Secondary | ICD-10-CM | POA: Diagnosis not present

## 2023-12-02 DIAGNOSIS — F3110 Bipolar disorder, current episode manic without psychotic features, unspecified: Secondary | ICD-10-CM | POA: Diagnosis not present

## 2023-12-02 DIAGNOSIS — F4310 Post-traumatic stress disorder, unspecified: Secondary | ICD-10-CM | POA: Diagnosis not present

## 2023-12-02 DIAGNOSIS — F4481 Dissociative identity disorder: Secondary | ICD-10-CM | POA: Diagnosis not present

## 2023-12-06 DIAGNOSIS — J441 Chronic obstructive pulmonary disease with (acute) exacerbation: Secondary | ICD-10-CM | POA: Diagnosis not present

## 2023-12-06 DIAGNOSIS — R062 Wheezing: Secondary | ICD-10-CM | POA: Diagnosis not present

## 2023-12-06 DIAGNOSIS — J45901 Unspecified asthma with (acute) exacerbation: Secondary | ICD-10-CM | POA: Diagnosis not present

## 2023-12-06 DIAGNOSIS — J189 Pneumonia, unspecified organism: Secondary | ICD-10-CM | POA: Diagnosis not present

## 2023-12-06 DIAGNOSIS — R0602 Shortness of breath: Secondary | ICD-10-CM | POA: Diagnosis not present

## 2023-12-14 ENCOUNTER — Other Ambulatory Visit: Payer: Self-pay | Admitting: Neurology

## 2023-12-23 DIAGNOSIS — M19011 Primary osteoarthritis, right shoulder: Secondary | ICD-10-CM | POA: Diagnosis not present

## 2023-12-23 DIAGNOSIS — M25511 Pain in right shoulder: Secondary | ICD-10-CM | POA: Insufficient documentation

## 2023-12-29 DIAGNOSIS — M19011 Primary osteoarthritis, right shoulder: Secondary | ICD-10-CM | POA: Diagnosis not present

## 2023-12-29 DIAGNOSIS — M25511 Pain in right shoulder: Secondary | ICD-10-CM | POA: Diagnosis not present

## 2024-01-14 DIAGNOSIS — M25511 Pain in right shoulder: Secondary | ICD-10-CM | POA: Diagnosis not present

## 2024-01-20 DIAGNOSIS — M19011 Primary osteoarthritis, right shoulder: Secondary | ICD-10-CM | POA: Diagnosis not present

## 2024-03-02 DIAGNOSIS — F314 Bipolar disorder, current episode depressed, severe, without psychotic features: Secondary | ICD-10-CM | POA: Diagnosis not present

## 2024-03-02 DIAGNOSIS — F411 Generalized anxiety disorder: Secondary | ICD-10-CM | POA: Diagnosis not present

## 2024-03-15 DIAGNOSIS — L4050 Arthropathic psoriasis, unspecified: Secondary | ICD-10-CM | POA: Diagnosis not present

## 2024-03-15 DIAGNOSIS — M1990 Unspecified osteoarthritis, unspecified site: Secondary | ICD-10-CM | POA: Diagnosis not present

## 2024-03-15 DIAGNOSIS — I7300 Raynaud's syndrome without gangrene: Secondary | ICD-10-CM | POA: Diagnosis not present

## 2024-03-15 DIAGNOSIS — Z79899 Other long term (current) drug therapy: Secondary | ICD-10-CM | POA: Diagnosis not present

## 2024-04-07 DIAGNOSIS — Z4789 Encounter for other orthopedic aftercare: Secondary | ICD-10-CM | POA: Insufficient documentation

## 2024-04-08 DIAGNOSIS — M19011 Primary osteoarthritis, right shoulder: Secondary | ICD-10-CM | POA: Diagnosis not present

## 2024-04-08 DIAGNOSIS — M25711 Osteophyte, right shoulder: Secondary | ICD-10-CM | POA: Diagnosis not present

## 2024-04-21 DIAGNOSIS — R7303 Prediabetes: Secondary | ICD-10-CM | POA: Insufficient documentation

## 2024-04-21 DIAGNOSIS — I7300 Raynaud's syndrome without gangrene: Secondary | ICD-10-CM | POA: Insufficient documentation

## 2024-04-21 DIAGNOSIS — L409 Psoriasis, unspecified: Secondary | ICD-10-CM | POA: Insufficient documentation

## 2024-04-21 DIAGNOSIS — F17200 Nicotine dependence, unspecified, uncomplicated: Secondary | ICD-10-CM | POA: Insufficient documentation

## 2024-04-21 DIAGNOSIS — N1831 Chronic kidney disease, stage 3a: Secondary | ICD-10-CM | POA: Insufficient documentation

## 2024-04-22 DIAGNOSIS — Z4789 Encounter for other orthopedic aftercare: Secondary | ICD-10-CM | POA: Diagnosis not present

## 2024-04-26 ENCOUNTER — Encounter: Payer: Self-pay | Admitting: Neurology

## 2024-04-26 ENCOUNTER — Ambulatory Visit: Admitting: Neurology

## 2024-04-26 VITALS — BP 120/86 | Resp 16 | Ht 62.0 in | Wt 178.5 lb

## 2024-04-26 DIAGNOSIS — G43709 Chronic migraine without aura, not intractable, without status migrainosus: Secondary | ICD-10-CM | POA: Insufficient documentation

## 2024-04-26 DIAGNOSIS — R569 Unspecified convulsions: Secondary | ICD-10-CM | POA: Diagnosis not present

## 2024-04-26 MED ORDER — SUMATRIPTAN SUCCINATE 50 MG PO TABS: ORAL_TABLET | ORAL | 11 refills | Status: AC

## 2024-04-26 MED ORDER — TOPIRAMATE 100 MG PO TABS: 100.0000 mg | ORAL_TABLET | Freq: Every day | ORAL | 3 refills | Status: AC

## 2024-04-26 NOTE — Progress Notes (Signed)
 Chief Complaint  Patient presents with   New Patient (Initial Visit)    Rm14, husband present,  referral for Possible Seizure/Dr. Cindy Setter [facility] [phone]: pt stated that their last sz like episode occurred around July 5th husband stated it was like a focal sz and passing out       ASSESSMENT AND PLAN  Cynthia Ramos is a 53 y.o. female   Seizure-like spell Depression anxiety Chronic migraine  [facility] Larry alone chief on  MRI of the brain with without contrast was normal in February 2024  EEG, if nonrevealing, she continue having spells, we will consider 72 hours video EEG monitoring  Topamax  100 mg every night as migraine prevention  Imitrex  as needed  Return in 6 months  DIAGNOSTIC DATA (LABS, IMAGING, TESTING) - I reviewed patient records, labs, notes, testing and imaging myself where available.   MEDICAL HISTORY:  Cynthia Ramos is a 53 year old female, seen in request by his primary care from [facility] Dr  Setter, [place], for evaluation of passing out episode, seizure-like activity, initial evaluation was on April 26, 2024  History is obtained from the patient and review of electronic medical records. I personally reviewed pertinent available imaging films in PACS.   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 HLD Bipolar GERD Psoriatic arthritis Pre-DM Fibromyalgia Chronic migraine   She had a few passing out episode, sometimes with seizure-like activity since March 2025, had total of 7-8 spells  Episode usually preceded dizziness, need to sit down, then body slumped over, sometimes body shaking episode, she felt foggy, fatigue rest of the day, sometimes have a headache,  This is often triggered by exertion, tends to happen in standing position, she can hear people talking, but could not respond,  She has a history of chronic migraine headaches for a long time, about 4 times each month previously Imitrex  was helpful,  now only taking ibuprofen Tylenol  with limited help  She suffered autoimmune disease, complains of body achy pain, multiple joints pain, was on tramadol 50 mg daily, was taken off  Watched videotape of her seizure-like spells, body thrashing, not consistent with typical epilepsy semiology   Laboratory evaluation February 2025, normal CMP with exception of mild elevation creatinine 1.0 mg, CBC hemoglobin of 13.9, normal ESR 5 C-reactive protein 6  PHYSICAL EXAM:   Vitals:   04/26/24 0933  BP: 120/86  Resp: 16  Weight: 178 lb 8 oz (81 kg)  Height: 5' 2 (1.575 m)     Body mass index is 32.65 kg/m.  PHYSICAL EXAMNIATION:  Gen: NAD, conversant, well nourised, well groomed                     Cardiovascular: Regular rate rhythm, no peripheral edema, warm, nontender. Eyes: Conjunctivae clear without exudates or hemorrhage Neck: Supple, no carotid bruits. Pulmonary: Clear to auscultation bilaterally   NEUROLOGICAL EXAM:  MENTAL STATUS: Speech/cognition: Awake, alert, oriented to history taking and casual conversation CRANIAL NERVES: CN II: Visual fields are full to confrontation. Pupils are round equal and briskly reactive to light. CN III, IV, VI: extraocular movement are normal. No ptosis. CN V: Facial sensation is intact to light touch CN VII: Face is symmetric with normal eye closure  CN VIII: Hearing is normal to causal conversation. CN IX, X: Phonation is normal. CN XI: Head turning and shoulder shrug are intact  MOTOR: There is no pronator drift of out-stretched arms. Muscle bulk and tone are  normal. Muscle strength is normal.  Right arm in sling  REFLEXES: Reflexes are 2+ and symmetric at the biceps, triceps, knees, and ankles. Plantar responses are flexor.  SENSORY: Intact to light touch, pinprick and vibratory sensation are intact in fingers and toes.  COORDINATION: There is no trunk or limb dysmetria noted.  GAIT/STANCE: Push-up, mildly antalgic  REVIEW  OF SYSTEMS:  Full 14 system review of systems performed and notable only for as above All other review of systems were negative.   ALLERGIES: Allergies  Allergen Reactions   Bee Venom Anaphylaxis    Other Reaction(s): Not available  Other Reaction(s): Unknown   Honey Bee Venom Anaphylaxis   Meloxicam Anaphylaxis   Methotrexate Derivatives Anaphylaxis   Sulfa Antibiotics Other (See Comments), Hives, Itching and Shortness Of Breath   Aspirin     Per patient bleeds out through skin   Misc. Sulfonamide Containing Compounds     Other Reaction(s): Not available   Penicillin G Other (See Comments)   Penicillins     Other Reaction(s): Not available    HOME MEDICATIONS: Current Outpatient Medications  Medication Sig Dispense Refill   acetaminophen  (TYLENOL ) 325 MG tablet Take 650 mg by mouth every 6 (six) hours as needed.     albuterol  (VENTOLIN  HFA) 108 (90 Base) MCG/ACT inhaler Inhale 2 puffs into the lungs every 4 (four) hours as needed.     atorvastatin (LIPITOR) 20 MG tablet Take 1 tablet by mouth daily.     augmented betamethasone dipropionate (DIPROLENE-AF) 0.05 % cream Apply topically.     EPINEPHrine 0.15 MG/0.15ML IJ injection Inject 0.15 mg into the muscle as needed for anaphylaxis.     escitalopram (LEXAPRO) 20 MG tablet Take 20 mg by mouth in the morning and at bedtime.     esomeprazole (NEXIUM) 40 MG capsule Take 40 mg by mouth daily.     folic acid (FOLVITE) 1 MG tablet Take 1 mg by mouth daily.     gabapentin  (NEURONTIN ) 300 MG capsule Take 1 capsule (300 mg total) by mouth 4 (four) times daily. 120 capsule 11   QUEtiapine (SEROQUEL XR) 300 MG 24 hr tablet Take 1 tablet by mouth every evening.     SEROQUEL 100 MG tablet Take 100 mg by mouth daily as needed (sleep).     SKYRIZI 150 MG/ML SOSY Inject into the skin.     No current facility-administered medications for this visit.    PAST MEDICAL HISTORY: Past Medical History:  Diagnosis Date   Arthritis    Bipolar  disorder (HCC)    Bipolar disorder, unspecified (HCC)    CKD (chronic kidney disease), stage III (HCC)    GERD (gastroesophageal reflux disease)    High cholesterol    Prediabetes    Reactive airway disease    Tobacco dependency     PAST SURGICAL HISTORY: Past Surgical History:  Procedure Laterality Date   GALLBLADDER SURGERY Right    OVARIAN CYST REMOVAL     TUBAL LIGATION      FAMILY HISTORY: Family History  Problem Relation Age of Onset   Heart attack Brother     SOCIAL HISTORY: Social History   Socioeconomic History   Marital status: Married    Spouse name: Not on file   Number of children: Not on file   Years of education: Not on file   Highest education level: Not on file  Occupational History   Not on file  Tobacco Use   Smoking status: Every Day  Current packs/day: 1.00    Average packs/day: 1 pack/day for 30.0 years (30.0 ttl pk-yrs)    Types: Cigarettes   Smokeless tobacco: Never  Vaping Use   Vaping status: Never Used  Substance and Sexual Activity   Alcohol use: Never   Drug use: Never   Sexual activity: Not on file  Other Topics Concern   Not on file  Social History Narrative   Not on file   Social Drivers of Health   Financial Resource Strain: Not on file  Food Insecurity: Not on file  Transportation Needs: Not on file  Physical Activity: Not on file  Stress: Not on file  Social Connections: Not on file  Intimate Partner Violence: Not on file      Modena Callander, M.D. Ph.D.  [facility] [address], [address] [place], [place] [address] Ph: [phone] Fax: [phone]  CC:  Curt Lighter, MD [address] [place],  [place] [address]  Alvera Reagin, [place]

## 2024-04-27 ENCOUNTER — Ambulatory Visit: Payer: Self-pay | Admitting: Neurology

## 2024-04-27 LAB — THYROID PANEL WITH TSH
Free Thyroxine Index: 1.3 (ref 1.2–4.9)
T3 Uptake Ratio: 22 % — ABNORMAL LOW (ref 24–39)
T4, Total: 5.8 ug/dL (ref 4.5–12.0)
TSH: 3.2 u[IU]/mL (ref 0.450–4.500)

## 2024-04-27 LAB — CK: Total CK: 83 U/L (ref 32–182)

## 2024-05-03 DIAGNOSIS — F314 Bipolar disorder, current episode depressed, severe, without psychotic features: Secondary | ICD-10-CM | POA: Diagnosis not present

## 2024-05-03 DIAGNOSIS — F411 Generalized anxiety disorder: Secondary | ICD-10-CM | POA: Diagnosis not present

## 2024-05-05 ENCOUNTER — Ambulatory Visit (INDEPENDENT_AMBULATORY_CARE_PROVIDER_SITE_OTHER): Admitting: Neurology

## 2024-05-05 DIAGNOSIS — R569 Unspecified convulsions: Secondary | ICD-10-CM | POA: Diagnosis not present

## 2024-05-05 DIAGNOSIS — G43709 Chronic migraine without aura, not intractable, without status migrainosus: Secondary | ICD-10-CM

## 2024-05-15 NOTE — Procedures (Signed)
   HISTORY: 53 years old presenting with seizure-like spells.  TECHNIQUE:  This is a routine 16 channel EEG recording with one channel devoted to a limited EKG recording.  It was performed during wakefulness, drowsiness and asleep.  Hyperventilation and photic stimulation were performed as activating procedures.  There are minimum muscle and movement artifact noted.  Upon maximum arousal, posterior dominant waking rhythm consistent of rhythmic alpha range activity. Activities are symmetric over the bilateral posterior derivations and attenuated with eye opening.  Photic stimulation did not alter the tracing.  Hyperventilation produced mild/moderate buildup with higher amplitude and the slower activities noted.  During EEG recording, patient developed drowsiness and entered sleep, sleep EEG demonstrated architecture, there were frontal centrally dominant vertex waves and symmetric sleep spindles noted.  During EEG recording, there was no epileptiform discharge noted.  EKG demonstrate normal sinus rhythm.  CONCLUSION: This is a  normal awake and asleep EEG.  There is no electrodiagnostic evidence of epileptiform discharge.  Draedyn Weidinger, M.D. Ph.D.  [facility] [address] [place], [place] [address] Phone: [phone] Fax:      [phone]

## 2024-05-18 DIAGNOSIS — R29898 Other symptoms and signs involving the musculoskeletal system: Secondary | ICD-10-CM | POA: Insufficient documentation

## 2024-05-18 DIAGNOSIS — M25611 Stiffness of right shoulder, not elsewhere classified: Secondary | ICD-10-CM | POA: Insufficient documentation

## 2024-06-06 DIAGNOSIS — J988 Other specified respiratory disorders: Secondary | ICD-10-CM | POA: Diagnosis not present

## 2024-06-06 DIAGNOSIS — F17210 Nicotine dependence, cigarettes, uncomplicated: Secondary | ICD-10-CM | POA: Diagnosis not present

## 2024-06-06 DIAGNOSIS — J45909 Unspecified asthma, uncomplicated: Secondary | ICD-10-CM | POA: Diagnosis not present

## 2024-06-06 DIAGNOSIS — D849 Immunodeficiency, unspecified: Secondary | ICD-10-CM | POA: Diagnosis not present

## 2024-06-06 DIAGNOSIS — R059 Cough, unspecified: Secondary | ICD-10-CM | POA: Diagnosis not present

## 2024-06-21 DIAGNOSIS — F314 Bipolar disorder, current episode depressed, severe, without psychotic features: Secondary | ICD-10-CM | POA: Diagnosis not present

## 2024-06-21 DIAGNOSIS — F411 Generalized anxiety disorder: Secondary | ICD-10-CM | POA: Diagnosis not present

## 2024-06-29 DIAGNOSIS — Z4789 Encounter for other orthopedic aftercare: Secondary | ICD-10-CM | POA: Diagnosis not present

## 2024-07-05 ENCOUNTER — Ambulatory Visit: Admission: EM | Admit: 2024-07-05 | Discharge: 2024-07-05 | Disposition: A | Discharge status: Home / Self Care

## 2024-07-05 ENCOUNTER — Emergency Department (HOSPITAL_COMMUNITY)

## 2024-07-05 ENCOUNTER — Other Ambulatory Visit: Payer: Self-pay

## 2024-07-05 ENCOUNTER — Emergency Department (HOSPITAL_COMMUNITY)
Admission: EM | Admit: 2024-07-05 | Discharge: 2024-07-06 | Disposition: A | Discharge status: Home / Self Care | Source: Ambulatory Visit

## 2024-07-05 DIAGNOSIS — R519 Headache, unspecified: Secondary | ICD-10-CM | POA: Diagnosis not present

## 2024-07-05 DIAGNOSIS — R6884 Jaw pain: Secondary | ICD-10-CM | POA: Insufficient documentation

## 2024-07-05 DIAGNOSIS — H53149 Visual discomfort, unspecified: Secondary | ICD-10-CM | POA: Insufficient documentation

## 2024-07-05 DIAGNOSIS — Z7952 Long term (current) use of systemic steroids: Secondary | ICD-10-CM | POA: Diagnosis not present

## 2024-07-05 DIAGNOSIS — J45909 Unspecified asthma, uncomplicated: Secondary | ICD-10-CM | POA: Insufficient documentation

## 2024-07-05 DIAGNOSIS — R220 Localized swelling, mass and lump, head: Secondary | ICD-10-CM | POA: Diagnosis not present

## 2024-07-05 DIAGNOSIS — D849 Immunodeficiency, unspecified: Secondary | ICD-10-CM | POA: Insufficient documentation

## 2024-07-05 DIAGNOSIS — R7989 Other specified abnormal findings of blood chemistry: Secondary | ICD-10-CM | POA: Diagnosis not present

## 2024-07-05 DIAGNOSIS — Z7951 Long term (current) use of inhaled steroids: Secondary | ICD-10-CM | POA: Insufficient documentation

## 2024-07-05 DIAGNOSIS — N1831 Chronic kidney disease, stage 3a: Secondary | ICD-10-CM | POA: Insufficient documentation

## 2024-07-05 DIAGNOSIS — R590 Localized enlarged lymph nodes: Secondary | ICD-10-CM | POA: Diagnosis not present

## 2024-07-05 DIAGNOSIS — H538 Other visual disturbances: Secondary | ICD-10-CM | POA: Insufficient documentation

## 2024-07-05 DIAGNOSIS — K219 Gastro-esophageal reflux disease without esophagitis: Secondary | ICD-10-CM | POA: Insufficient documentation

## 2024-07-05 DIAGNOSIS — N189 Chronic kidney disease, unspecified: Secondary | ICD-10-CM | POA: Diagnosis not present

## 2024-07-05 LAB — CBC WITH DIFFERENTIAL/PLATELET
Abs Immature Granulocytes: 0.08 K/uL — ABNORMAL HIGH (ref 0.00–0.07)
Basophils Absolute: 0.1 K/uL (ref 0.0–0.1)
Basophils Relative: 1 %
Eosinophils Absolute: 0.3 K/uL (ref 0.0–0.5)
Eosinophils Relative: 3 %
HCT: 43.5 % (ref 36.0–46.0)
Hemoglobin: 13.7 g/dL (ref 12.0–15.0)
Immature Granulocytes: 1 %
Lymphocytes Relative: 39 %
Lymphs Abs: 3.9 K/uL (ref 0.7–4.0)
MCH: 28.7 pg (ref 26.0–34.0)
MCHC: 31.5 g/dL (ref 30.0–36.0)
MCV: 91 fL (ref 80.0–100.0)
Monocytes Absolute: 0.6 K/uL (ref 0.1–1.0)
Monocytes Relative: 7 %
Neutro Abs: 4.9 K/uL (ref 1.7–7.7)
Neutrophils Relative %: 49 %
Platelets: 279 K/uL (ref 150–400)
RBC: 4.78 MIL/uL (ref 3.87–5.11)
RDW: 13.7 % (ref 11.5–15.5)
Smear Review: NORMAL
WBC: 9.9 K/uL (ref 4.0–10.5)
nRBC: 0 % (ref 0.0–0.2)

## 2024-07-05 LAB — COMPREHENSIVE METABOLIC PANEL WITH GFR
ALT: 8 U/L (ref 0–44)
AST: 15 U/L (ref 15–41)
Albumin: 4.1 g/dL (ref 3.5–5.0)
Alkaline Phosphatase: 80 U/L (ref 38–126)
Anion gap: 10 (ref 5–15)
BUN: 9 mg/dL (ref 6–20)
CO2: 25 mmol/L (ref 22–32)
Calcium: 8.9 mg/dL (ref 8.9–10.3)
Chloride: 107 mmol/L (ref 98–111)
Creatinine, Ser: 1.1 mg/dL — ABNORMAL HIGH (ref 0.44–1.00)
GFR, Estimated: >60 mL/min (ref >60)
Glucose, Bld: 92 mg/dL (ref 70–99)
Potassium: 4.2 mmol/L (ref 3.5–5.1)
Sodium: 142 mmol/L (ref 135–145)
Total Bilirubin: 0.3 mg/dL (ref 0.0–1.2)
Total Protein: 6.5 g/dL (ref 6.5–8.1)

## 2024-07-05 MED ORDER — DIPHENHYDRAMINE HCL 50 MG/ML IJ SOLN
25.0000 mg | Freq: Once | INTRAMUSCULAR | Status: AC
  Administered 2024-07-05: 25 mg via INTRAVENOUS
  Filled 2024-07-05: qty 1

## 2024-07-05 MED ORDER — MAGNESIUM SULFATE IN D5W 1-5 GM/100ML-% IV SOLN
1.0000 g | Freq: Once | INTRAVENOUS | Status: AC
  Administered 2024-07-05: 1 g via INTRAVENOUS
  Filled 2024-07-05: qty 100

## 2024-07-05 MED ORDER — METOCLOPRAMIDE HCL 5 MG/ML IJ SOLN
10.0000 mg | Freq: Once | INTRAMUSCULAR | Status: AC
  Administered 2024-07-05: 10 mg via INTRAVENOUS
  Filled 2024-07-05: qty 2

## 2024-07-05 MED ORDER — KETOROLAC TROMETHAMINE 15 MG/ML IJ SOLN
15.0000 mg | Freq: Once | INTRAMUSCULAR | Status: AC
  Administered 2024-07-05: 15 mg via INTRAVENOUS
  Filled 2024-07-05: qty 1

## 2024-07-05 MED ORDER — ALBUTEROL SULFATE HFA 108 (90 BASE) MCG/ACT IN AERS
1.0000 | INHALATION_SPRAY | Freq: Once | RESPIRATORY_TRACT | Status: AC
  Administered 2024-07-05: 2 via RESPIRATORY_TRACT
  Filled 2024-07-05: qty 6.7

## 2024-07-05 MED ORDER — DEXAMETHASONE SOD PHOSPHATE PF 10 MG/ML IJ SOLN
10.0000 mg | Freq: Once | INTRAMUSCULAR | Status: AC
  Administered 2024-07-05: 10 mg via INTRAVENOUS

## 2024-07-05 MED ORDER — IOHEXOL 300 MG/ML  SOLN
75.0000 mL | Freq: Once | INTRAMUSCULAR | Status: AC | PRN
Start: 2024-07-05 — End: 2024-07-05
  Administered 2024-07-05: 75 mL via INTRAVENOUS

## 2024-07-05 NOTE — ED Triage Notes (Signed)
 Patient presents to Urgent Care with headache symptoms that began yesterday, described as almost like a migraine, with radiation of pain to both sides of the jaw. No vision changes at that time. This morning, the headache localized to the right side, and patient now reports right jaw swelling extending down to the neck, accompanied by intermittent right ear pain. Tried cold compresses without relief. Migraine medication has been ineffective. Denies fever. Also reports a cough, described as intermittent and chronic. Patient expresses concern due to underlying autoimmune condition.

## 2024-07-05 NOTE — ED Notes (Signed)
 Hooked patient back up to IV pump.

## 2024-07-05 NOTE — ED Provider Notes (Signed)
 [facility]    CSN: [id number] Arrival date & time: 07/05/24  1913      History   Chief Complaint Chief Complaint  Patient presents with   Facial Swelling   Neck Pain    HPI Cynthia Ramos is a 53 y.o. female   Patient presents to Urgent Care with headache symptoms that began yesterday, described as almost like a migraine, with radiation of pain to both sides of the jaw.  She has a history of migraines and seizure-like activity, for which she is followed by neurology.  No vision changes at that time. This morning, the headache localized to the right side, and patient now reports right jaw swelling extending down to the neck, accompanied by intermittent right ear pain. Tried cold compresses without relief. Migraine medication has been ineffective. Takes Topamax  at bedtime and sumatriptan  prn, which she has taken already today. Denies fever. Also reports a cough, described as intermittent and chronic. Patient expresses concern due to underlying autoimmune condition.     HPI  Past Medical History:  Diagnosis Date   Arthritis    Bipolar disorder (HCC)    Bipolar disorder, unspecified (HCC)    CKD (chronic kidney disease), stage III (HCC)    GERD (gastroesophageal reflux disease)    High cholesterol    Prediabetes    Reactive airway disease    Tobacco dependency     Patient Active Problem List   Diagnosis Date Noted   Immunodeficiency disorder 07/05/2024   GERD (gastroesophageal reflux disease) 07/05/2024   Chronic kidney disease, stage 3a (HCC) 07/05/2024   Stiffness of right shoulder joint 05/18/2024   Weakness of shoulder 05/18/2024   Seizure-like activity (HCC) 04/26/2024   Chronic migraine w/o aura w/o status migrainosus, not intractable 04/26/2024   Psoriasis 04/21/2024   Prediabetes 04/21/2024   Raynaud disease 04/21/2024   Stage 3a chronic kidney disease (HCC) 04/21/2024   Tobacco dependence 04/21/2024   Encounter for other orthopedic aftercare  04/07/2024   Pain in joint of right shoulder 12/23/2023   Osteoarthritis of right glenohumeral joint 11/10/2023   Asthma 11/03/2022   Reactive airway disease 11/03/2022   Bipolar disorder (HCC) 11/03/2022   Chronic kidney disease (CKD) 11/03/2022   Fibromyalgia 11/03/2022   Fluctuating blood pressure 11/03/2022   Gastroesophageal reflux disease 11/03/2022   High cholesterol 11/03/2022   Inflammatory arthritis 11/03/2022   Osteoarthritis 11/03/2022   Psoriatic arthritis (HCC) 11/03/2022   Raynaud phenomenon 11/03/2022   SOB (shortness of breath) on exertion 11/03/2022   Paresthesia 11/03/2022   Gait abnormality 11/03/2022   Vision abnormalities 11/03/2022    Past Surgical History:  Procedure Laterality Date   GALLBLADDER SURGERY Right    OVARIAN CYST REMOVAL     TUBAL LIGATION      OB History   No obstetric history on file.      Home Medications    Prior to Admission medications   Medication Sig Start Date End Date Taking? Authorizing Provider  cyclobenzaprine (FLEXERIL) 10 MG tablet Take 10 mg by mouth every 6 (six) hours as needed. 04/07/24  Yes [provider]  ibuprofen (ADVIL) 800 MG tablet Take 800 mg by mouth every 8 (eight) hours as needed. 06/06/24  Yes [provider]  ondansetron (ZOFRAN) 4 MG tablet Take 4 mg by mouth every 6 (six) hours as needed. 04/07/24  Yes [provider]  oxyCODONE-acetaminophen  (PERCOCET/ROXICET) 5-325 MG tablet Take 1 tablet by mouth every 4 (four) hours as needed. 04/07/24  Yes [provider]  predniSONE (DELTASONE) 20 MG tablet Take 20 mg by mouth as directed. 06/06/24  Yes [provider]  acetaminophen  (TYLENOL ) 325 MG tablet Take 650 mg by mouth every 6 (six) hours as needed.    [provider]  albuterol  (VENTOLIN  HFA) 108 (90 Base) MCG/ACT inhaler Inhale 2 puffs into the lungs every 4 (four) hours as needed. 12/26/20   [provider]  atorvastatin (LIPITOR) 20 MG tablet  Take 1 tablet by mouth daily. 12/31/20   [provider]  augmented betamethasone dipropionate (DIPROLENE-AF) 0.05 % cream Apply topically. 10/15/22   [provider]  EPINEPHrine 0.15 MG/0.15ML IJ injection Inject 0.15 mg into the muscle as needed for anaphylaxis.    [provider]  escitalopram (LEXAPRO) 20 MG tablet Take 20 mg by mouth in the morning and at bedtime.    [provider]  esomeprazole (NEXIUM) 40 MG capsule Take 40 mg by mouth daily. 10/31/20   [provider]  folic acid (FOLVITE) 1 MG tablet Take 1 mg by mouth daily. 10/15/20   [provider]  gabapentin  (NEURONTIN ) 300 MG capsule Take 1 capsule (300 mg total) by mouth 4 (four) times daily. 11/03/22   Onita Duos, MD  QUEtiapine (SEROQUEL XR) 300 MG 24 hr tablet Take 1 tablet by mouth every evening.    [provider]  SEROQUEL 100 MG tablet Take 100 mg by mouth daily as needed (sleep).    [provider]  SKYRIZI 150 MG/ML SOSY Inject into the skin. 07/31/22   [provider]  SUMAtriptan  (IMITREX ) 50 MG tablet May repeat in 2 hours if headache persists or recurs. 04/26/24   Onita Duos, MD  topiramate  (TOPAMAX ) 100 MG tablet Take 1 tablet (100 mg total) by mouth at bedtime. 04/26/24   Onita Duos, MD    Family History Family History  Problem Relation Age of Onset   Heart attack Brother     Social History Social History   Tobacco Use   Smoking status: Every Day    Current packs/day: 1.00    Average packs/day: 1 pack/day for 30.0 years (30.0 ttl pk-yrs)    Types: Cigarettes   Smokeless tobacco: Never  Vaping Use   Vaping status: Never Used  Substance Use Topics   Alcohol use: Never   Drug use: Yes    Types: Marijuana    Comment: THC Gummies 10 mg     Allergies   Bee venom, Honey bee venom, Meloxicam, Methotrexate and trimetrexate, Sulfa antibiotics, Aspirin, Misc. sulfonamide containing compounds, Penicillin g, and  Penicillins   Review of Systems Review of Systems  Neurological:  Positive for headaches.     Physical Exam Triage Vital Signs ED Triage Vitals  Encounter Vitals Group     BP 07/05/24 1928 121/84     Girls Systolic BP Percentile --      Girls Diastolic BP Percentile --      Boys Systolic BP Percentile --      Boys Diastolic BP Percentile --      Pulse Rate 07/05/24 1928 74     Resp 07/05/24 1928 20     Temp 07/05/24 1928 98.5 F (36.9 C)     Temp Source 07/05/24 1928 Oral     SpO2 07/05/24 1928 92 %     Weight 07/05/24 1925 172 lb 4.8 oz (78.2 kg)     Height 07/05/24 1925 5' 3 (1.6 m)     Head Circumference --  Peak Flow --      Pain Score 07/05/24 1925 7     Pain Loc --      Pain Education --      Exclude from Growth Chart --    No data found.  Updated Vital Signs BP 121/84 (BP Location: Left Arm)   Pulse 74   Temp 98.5 F (36.9 C) (Oral)   Resp 20   Ht 5' 3 (1.6 m)   Wt 172 lb 4.8 oz (78.2 kg)   SpO2 92%   BMI 30.52 kg/m   Visual Acuity Right Eye Distance:   Left Eye Distance:   Bilateral Distance:    Right Eye Near:   Left Eye Near:    Bilateral Near:     Physical Exam Vitals reviewed.  Constitutional:      General: She is not in acute distress.    Appearance: Normal appearance. She is not ill-appearing.  HENT:     Head: Normocephalic and atraumatic.     Comments: There is swelling at the base of each ear, that extends into the jaw.  It is exquisitely tender to palpation.  There is no TMJ or trismus. Eyes:     Extraocular Movements: Extraocular movements intact.     Pupils: Pupils are equal, round, and reactive to light.  Cardiovascular:     Rate and Rhythm: Normal rate and regular rhythm.     Heart sounds: Normal heart sounds.  Pulmonary:     Effort: Pulmonary effort is normal.     Breath sounds: Normal breath sounds. No wheezing, rhonchi or rales.  Musculoskeletal:     Cervical back: Normal range of motion and neck supple. No  rigidity.  Lymphadenopathy:     Cervical: No cervical adenopathy.  Skin:    Capillary Refill: Capillary refill takes less than 2 seconds.  Neurological:     General: No focal deficit present.     Mental Status: She is alert and oriented to person, place, and time. Mental status is at baseline.     Cranial Nerves: No cranial nerve deficit or facial asymmetry.     Sensory: Sensation is intact. No sensory deficit.     Motor: Motor function is intact. No weakness.     Coordination: Coordination is intact. Coordination normal.     Gait: Gait is intact. Gait normal.     Comments: CN 2-12 intact. No weakness or numbness in UEs or LEs.  Psychiatric:        Mood and Affect: Mood normal.        Behavior: Behavior normal.        Thought Content: Thought content normal.        Judgment: Judgment normal.      UC Treatments / Results  Labs (all labs ordered are listed, but only abnormal results are displayed) Labs Reviewed - No data to display  EKG   Radiology No results found.  Procedures Procedures (including critical care time)  Medications Ordered in UC Medications - No data to display  Initial Impression / Assessment and Plan / UC Course  I have reviewed the triage vital signs and the nursing notes.  Pertinent labs & imaging results that were available during my care of the patient were reviewed by me and considered in my medical decision making (see chart for details).     Patient is a pleasant 53 year old female presenting with severe headache.  She does have a history of migraine headaches and seizure-like activity, but this headache  is different than normal.  It has not been controlled with Topamax  and sumatriptan .  Sent to emergency department and POV driven by husband for further management.  Final Clinical Impressions(s) / UC Diagnoses   Final diagnoses:  Severe headache     Discharge Instructions      -Please head to the Emergency Room for your severe,  atypical headache.     ED Prescriptions   None    PDMP not reviewed this encounter.   Arlyss Leita BRAVO, PA-C 07/05/24 [phone]

## 2024-07-05 NOTE — ED Provider Notes (Signed)
 [place] EMERGENCY DEPARTMENT AT [facility] Provider Note   CSN: [id number] Arrival date & time: 07/05/24  2006     Patient presents with: Headache and Facial Swelling   Cynthia Ramos is a 53 y.o. female past medical history significant for asthma, CKD, GERD and migraine presents today for headache that started yesterday with right sided jaw/neck swelling.  Patient has taken her Topamax  and sumatriptan  without relief.  Patient also reporting some blurred vision and photophobia.  Patient denies neck stiffness, fever, chills, nausea, vomiting, injury, dizziness, dental pain, diplopia, sore throat, shortness of breath, difficulty breathing, any other complaints at this time.    Headache Associated symptoms: photophobia        Prior to Admission medications   Medication Sig Start Date End Date Taking? Authorizing Provider  acetaminophen  (TYLENOL ) 325 MG tablet Take 650 mg by mouth every 6 (six) hours as needed.    [provider]  albuterol  (VENTOLIN  HFA) 108 (90 Base) MCG/ACT inhaler Inhale 2 puffs into the lungs every 4 (four) hours as needed. 12/26/20   [provider]  atorvastatin (LIPITOR) 20 MG tablet Take 1 tablet by mouth daily. 12/31/20   [provider]  augmented betamethasone dipropionate (DIPROLENE-AF) 0.05 % cream Apply topically. 10/15/22   [provider]  cyclobenzaprine (FLEXERIL) 10 MG tablet Take 10 mg by mouth every 6 (six) hours as needed. 04/07/24   [provider]  EPINEPHrine 0.15 MG/0.15ML IJ injection Inject 0.15 mg into the muscle as needed for anaphylaxis.    [provider]  escitalopram (LEXAPRO) 20 MG tablet Take 20 mg by mouth in the morning and at bedtime.    [provider]  esomeprazole (NEXIUM) 40 MG capsule Take 40 mg by mouth daily. 10/31/20   [provider]  folic acid (FOLVITE) 1 MG tablet Take 1 mg by mouth daily. 10/15/20   [provider]  gabapentin   (NEURONTIN ) 300 MG capsule Take 1 capsule (300 mg total) by mouth 4 (four) times daily. 11/03/22   Onita Duos, MD  ibuprofen (ADVIL) 800 MG tablet Take 800 mg by mouth every 8 (eight) hours as needed. 06/06/24   [provider]  ondansetron (ZOFRAN) 4 MG tablet Take 4 mg by mouth every 6 (six) hours as needed. 04/07/24   [provider]  oxyCODONE-acetaminophen  (PERCOCET/ROXICET) 5-325 MG tablet Take 1 tablet by mouth every 4 (four) hours as needed. 04/07/24   [provider]  predniSONE (DELTASONE) 20 MG tablet Take 20 mg by mouth as directed. 06/06/24   [provider]  QUEtiapine (SEROQUEL XR) 300 MG 24 hr tablet Take 1 tablet by mouth every evening.    [provider]  SEROQUEL 100 MG tablet Take 100 mg by mouth daily as needed (sleep).    [provider]  SKYRIZI 150 MG/ML SOSY Inject into the skin. 07/31/22   [provider]  SUMAtriptan  (IMITREX ) 50 MG tablet May repeat in 2 hours if headache persists or recurs. 04/26/24   Onita Duos, MD  topiramate  (TOPAMAX ) 100 MG tablet Take 1 tablet (100 mg total) by mouth at bedtime. 04/26/24   Onita Duos, MD    Allergies: Bee venom, Honey bee venom, Meloxicam, Methotrexate and trimetrexate, Sulfa antibiotics, Aspirin, Misc. sulfonamide containing compounds, Penicillin g, and Penicillins    Review of Systems  Eyes:  Positive for photophobia.  Neurological:  Positive for headaches.    Updated Vital Signs BP 105/73   Pulse 69   Temp 98.3  F (36.8 C) (Oral)   Resp 16   Ht 5' 3 (1.6 m)   Wt 78.2 kg   SpO2 93%   BMI 30.52 kg/m   Physical Exam Vitals and nursing note reviewed.  Constitutional:      General: She is not in acute distress.    Appearance: She is well-developed. She is not toxic-appearing.  HENT:     Head: Normocephalic and atraumatic.     Jaw: No trismus or swelling.     Comments: Hypertrophy of bilateral masseters    Mouth/Throat:     Mouth: Mucous membranes are  moist.     Pharynx: Oropharynx is clear.  Eyes:     Extraocular Movements: Extraocular movements intact.     Right eye: Normal extraocular motion and no nystagmus.     Left eye: Normal extraocular motion and no nystagmus.     Conjunctiva/sclera: Conjunctivae normal.     Pupils: Pupils are equal, round, and reactive to light.  Neck:     Meningeal: Brudzinski's sign and Kernig's sign absent.     Comments: Extreme tenderness to palpation and moderate swelling noted to patient's right angle of mandible without erythema, notable fluctuance, warmth, or induration. Cardiovascular:     Rate and Rhythm: Normal rate and regular rhythm.     Heart sounds: No murmur heard. Pulmonary:     Effort: Pulmonary effort is normal. No respiratory distress.     Breath sounds: Wheezing present.     Comments: Minimal wheezing noted in all lobes Abdominal:     Palpations: Abdomen is soft.     Tenderness: There is no abdominal tenderness.  Musculoskeletal:        General: No swelling.     Cervical back: Neck supple. No rigidity.  Lymphadenopathy:     Cervical: Cervical adenopathy present.  Skin:    General: Skin is warm and dry.     Capillary Refill: Capillary refill takes less than 2 seconds.  Neurological:     Mental Status: She is alert.     GCS: GCS eye subscore is 4. GCS verbal subscore is 5. GCS motor subscore is 6.  Psychiatric:        Mood and Affect: Mood normal.     (all labs ordered are listed, but only abnormal results are displayed) Labs Reviewed  COMPREHENSIVE METABOLIC PANEL WITH GFR - Abnormal; Notable for the following components:      Result Value   Creatinine, Ser 1.10 (*)    All other components within normal limits  CBC WITH DIFFERENTIAL/PLATELET - Abnormal; Notable for the following components:   Abs Immature Granulocytes 0.08 (*)    All other components within normal limits    EKG: None  Radiology: CT Soft Tissue Neck W Contrast Result Date: 07/05/2024 EXAM: CT NECK  WITH CONTRAST 07/05/2024 10:49:43 PM TECHNIQUE: CT of the neck was performed with the administration of 75 mL of iohexol  (OMNIPAQUE ) 300 MG/ML solution. Multiplanar reformatted images are provided for review. Automated exposure control, iterative reconstruction, and/or weight based adjustment of the mA/kV was utilized to reduce the radiation dose to as low as reasonably achievable. COMPARISON: None available. CLINICAL HISTORY: Soft tissue infection suspected, neck, xray done. Patient reports concern for headaches that started yesterday associated with right sided swelling. She woke up yesterday with head all over. Progressed throughout the day. Today headache is primarily on her right side. She reports new pain with swelling and tenderness to touch behind her right ear and into her jaw. Has  been compliant with her Topamax  without improvement. No dental concerns. No dizziness. No lightheadedness. No nausea or vomiting. No fevers. No known injury/trauma. FINDINGS: AERODIGESTIVE TRACT: No discrete mass. No edema. SALIVARY GLANDS: The parotid and submandibular glands are unremarkable. THYROID : Unremarkable. LYMPH NODES: No suspicious cervical lymphadenopathy. SOFT TISSUES: No mass or fluid collection. BRAIN, ORBITS, SINUSES AND MASTOIDS: No acute abnormality. LUNGS AND MEDIASTINUM: No acute abnormality. BONES: No focal bone abnormality. IMPRESSION: 1. No acute abnormality or discrete neck mass identified. Electronically signed by: Franky Stanford MD 07/05/2024 10:55 PM EDT RP Workstation: [id number]   CT Head Wo Contrast Result Date: 07/05/2024 EXAM: CT HEAD WITHOUT CONTRAST 07/05/2024 10:49:43 PM TECHNIQUE: CT of the head was performed without the administration of intravenous contrast. Automated exposure control, iterative reconstruction, and/or weight based adjustment of the mA/kV was utilized to reduce the radiation dose to as low as reasonably achievable. COMPARISON: MRI head 11/12/2022. CLINICAL HISTORY: New  onset headache, right-sided, associated with right-sided swelling, pain, and tenderness behind the right ear and into the jaw. No improvement with topamax . No dental concerns, dizziness, lightheadedness, nausea/vomiting, fevers, or known injury/trauma. FINDINGS: BRAIN AND VENTRICLES: No acute hemorrhage. No evidence of acute infarct. No hydrocephalus. No extra-axial collection. No mass effect or midline shift. ORBITS: No acute abnormality. SINUSES: No acute abnormality. SOFT TISSUES AND SKULL: No acute soft tissue abnormality. No skull fracture. IMPRESSION: 1. No acute intracranial abnormality. Electronically signed by: Norman Gatlin MD 07/05/2024 10:53 PM EDT RP Workstation: [id number]     Procedures   Medications Ordered in the ED  dexamethasone  (DECADRON ) injection 10 mg (10 mg Intravenous Given 07/05/24 2221)  metoCLOPramide (REGLAN) injection 10 mg (10 mg Intravenous Given 07/05/24 2222)  magnesium sulfate IVPB 1 g 100 mL (1 g Intravenous New Bag/Given 07/05/24 2224)  albuterol  (VENTOLIN  HFA) 108 (90 Base) MCG/ACT inhaler 1-2 puff (2 puffs Inhalation Given 07/05/24 2225)  iohexol  (OMNIPAQUE ) 300 MG/ML solution 75 mL (75 mLs Intravenous Contrast Given 07/05/24 2237)  ketorolac (TORADOL) 15 MG/ML injection 15 mg (15 mg Intravenous Given 07/05/24 2322)  diphenhydrAMINE (BENADRYL) injection 25 mg (25 mg Intravenous Given 07/05/24 2323)                                    Medical Decision Making Amount and/or Complexity of Data Reviewed Radiology: ordered.  Risk Prescription drug management.   This patient presents to the ED for concern of headache and jaw/neck swelling differential diagnosis includes abscess, headache, brain bleed, cellulitis    Additional history obtained   Additional history obtained from Electronic Medical Record External records from outside source obtained and reviewed including Care Everywhere   Lab Tests:  I Ordered, and personally interpreted labs.   The pertinent results include: CBC unremarkable, elevated creatinine at 1.10 consistent with CKD   Imaging Studies ordered:  I ordered imaging studies including CT head Noncon and CT soft tissue neck with contrast I independently visualized and interpreted imaging which showed Negative I agree with the radiologist interpretation   Medicines ordered and prescription drug management:  I ordered medication including migraine cocktail and albuterol  I have reviewed the patients home medicines and have made adjustments as needed   Problem List / ED Course:  Upon reassessment patient is resting comfortably and states that her symptoms have resolved with the migraine cocktail Considered for admission or further workup however patient's vital signs, physical exam, labs, and imaging are reassuring.  Patient's symptoms  likely due to non-intractable headache.  Patient advised to follow-up with her neurologist if her headaches continue and dentist or oral surgeon if her jaw pain persists.  Patient given return precautions.  I feel patient safe for discharge at this time.     Final diagnoses:  Acute nonintractable headache, unspecified headache type  Jaw pain    ED Discharge Orders     None          Francis Ileana LOISE DEVONNA 07/06/24 0004    Palumbo, April, MD 07/06/24 [phone]

## 2024-07-05 NOTE — ED Triage Notes (Signed)
 Pt reports concern for headaches that started yesterday associated with right sided swelling. Sts she woke up yesterday with head all over. Progressed throughout the day. Today headache is primarily on her right side. She reports new pain with swelling and tenderness to touch behind her right ear and into her jaw. Has been complaint with her topamax  without improvement. No dental concerns. No dizziness. No lightheaded. And no NV. No fevers. No known injury/trauma.

## 2024-07-05 NOTE — Discharge Instructions (Addendum)
 You were seen for a headache.  Please follow-up with neurology if your symptoms persist for further evaluation and workup.  Please follow-up with your dentist or an oral surgeon if your jaw pain persist.  Thank you for letting us  treat you today. After reviewing your labs and imaging, I feel you are safe to go home. Please follow up with your PCP in the next several days and provide them with your records from this visit. Return to the Emergency Room if pain becomes severe or symptoms worsen.

## 2024-07-05 NOTE — Discharge Instructions (Addendum)
-  Please head to the Emergency Room for your severe, atypical headache.

## 2024-07-05 NOTE — ED Notes (Signed)
 Patient is being discharged from the Urgent Care and sent to the Emergency Department via Private Vehicle (Family) . Per Provider, patient is in need of higher level of care due to Migraine with no relief extending to jaw/ear. Patient is aware and verbalizes understanding of plan of care.  Vitals:   07/05/24 1928  BP: 121/84  Pulse: 74  Resp: 20  Temp: 98.5 F (36.9 C)  SpO2: 92%

## 2024-07-05 NOTE — ED Provider Notes (Incomplete)
 [place] [place] EMERGENCY DEPARTMENT AT [facility] Provider Note   CSN: [id number] Arrival date & time: 07/05/24  2006     Patient presents with: Headache and Facial Swelling   Cynthia Ramos is a 53 y.o. female past medical history significant for asthma, CKD, GERD and migraine presents today for headache that started yesterday with right sided jaw/neck swelling.  Patient has taken her Topamax  and sumatriptan  without relief.  Patient also reporting some blurred vision and photophobia.  Patient denies neck stiffness, fever, chills, nausea, vomiting, injury, dizziness, dental pain, diplopia, sore throat, shortness of breath, difficulty breathing, any other complaints at this time.  {Add pertinent medical, surgical, social history, OB history to HPI:32947}  Headache Associated symptoms: photophobia        Prior to Admission medications   Medication Sig Start Date End Date Taking? Authorizing Provider  acetaminophen  (TYLENOL ) 325 MG tablet Take 650 mg by mouth every 6 (six) hours as needed.    [provider]  albuterol  (VENTOLIN  HFA) 108 (90 Base) MCG/ACT inhaler Inhale 2 puffs into the lungs every 4 (four) hours as needed. 12/26/20   [provider]  atorvastatin (LIPITOR) 20 MG tablet Take 1 tablet by mouth daily. 12/31/20   [provider]  augmented betamethasone dipropionate (DIPROLENE-AF) 0.05 % cream Apply topically. 10/15/22   [provider]  cyclobenzaprine (FLEXERIL) 10 MG tablet Take 10 mg by mouth every 6 (six) hours as needed. 04/07/24   [provider]  EPINEPHrine 0.15 MG/0.15ML IJ injection Inject 0.15 mg into the muscle as needed for anaphylaxis.    [provider]  escitalopram (LEXAPRO) 20 MG tablet Take 20 mg by mouth in the morning and at bedtime.    [provider]  esomeprazole (NEXIUM) 40 MG capsule Take 40 mg by mouth daily. 10/31/20   [provider]  folic acid (FOLVITE) 1 MG tablet  Take 1 mg by mouth daily. 10/15/20   [provider]  gabapentin  (NEURONTIN ) 300 MG capsule Take 1 capsule (300 mg total) by mouth 4 (four) times daily. 11/03/22   Onita Duos, MD  ibuprofen (ADVIL) 800 MG tablet Take 800 mg by mouth every 8 (eight) hours as needed. 06/06/24   [provider]  ondansetron (ZOFRAN) 4 MG tablet Take 4 mg by mouth every 6 (six) hours as needed. 04/07/24   [provider]  oxyCODONE-acetaminophen  (PERCOCET/ROXICET) 5-325 MG tablet Take 1 tablet by mouth every 4 (four) hours as needed. 04/07/24   [provider]  predniSONE (DELTASONE) 20 MG tablet Take 20 mg by mouth as directed. 06/06/24   [provider]  QUEtiapine (SEROQUEL XR) 300 MG 24 hr tablet Take 1 tablet by mouth every evening.    [provider]  SEROQUEL 100 MG tablet Take 100 mg by mouth daily as needed (sleep).    [provider]  SKYRIZI 150 MG/ML SOSY Inject into the skin. 07/31/22   [provider]  SUMAtriptan  (IMITREX ) 50 MG tablet May repeat in 2 hours if headache persists or recurs. 04/26/24   Onita Duos, MD  topiramate  (TOPAMAX ) 100 MG tablet Take 1 tablet (100 mg total) by mouth at bedtime. 04/26/24   Onita Duos, MD    Allergies: Bee venom, Honey bee venom, Meloxicam, Methotrexate and trimetrexate, Sulfa antibiotics, Aspirin, Misc. sulfonamide containing compounds, Penicillin g, and Penicillins    Review of Systems  Eyes:  Positive for photophobia.  Neurological:  Positive for headaches.    Updated Vital Signs BP  131/84 (BP Location: Right Arm)   Pulse 74   Temp 98.3 F (36.8 C) (Oral)   Resp 15   Ht 5' 3 (1.6 m)   Wt 78.2 kg   SpO2 97%   BMI 30.52 kg/m   Physical Exam Vitals and nursing note reviewed.  Constitutional:      General: She is not in acute distress.    Appearance: She is well-developed. She is not toxic-appearing.  HENT:     Head: Normocephalic and atraumatic.     Jaw: No trismus or swelling.      Comments: Hypertrophy of bilateral masseters    Mouth/Throat:     Mouth: Mucous membranes are moist.     Pharynx: Oropharynx is clear.  Eyes:     Extraocular Movements: Extraocular movements intact.     Right eye: Normal extraocular motion and no nystagmus.     Left eye: Normal extraocular motion and no nystagmus.     Conjunctiva/sclera: Conjunctivae normal.     Pupils: Pupils are equal, round, and reactive to light.  Neck:     Meningeal: Brudzinski's sign and Kernig's sign absent.     Comments: Extreme tenderness to palpation and moderate swelling noted to patient's right angle of mandible without erythema, notable fluctuance, warmth, or induration. Cardiovascular:     Rate and Rhythm: Normal rate and regular rhythm.     Heart sounds: No murmur heard. Pulmonary:     Effort: Pulmonary effort is normal. No respiratory distress.     Breath sounds: Wheezing present.     Comments: Minimal wheezing noted in all lobes Abdominal:     Palpations: Abdomen is soft.     Tenderness: There is no abdominal tenderness.  Musculoskeletal:        General: No swelling.     Cervical back: Neck supple. No rigidity.  Lymphadenopathy:     Cervical: Cervical adenopathy present.  Skin:    General: Skin is warm and dry.     Capillary Refill: Capillary refill takes less than 2 seconds.  Neurological:     Mental Status: She is alert.     GCS: GCS eye subscore is 4. GCS verbal subscore is 5. GCS motor subscore is 6.  Psychiatric:        Mood and Affect: Mood normal.     (all labs ordered are listed, but only abnormal results are displayed) Labs Reviewed  COMPREHENSIVE METABOLIC PANEL WITH GFR - Abnormal; Notable for the following components:      Result Value   Creatinine, Ser 1.10 (*)    All other components within normal limits  CBC WITH DIFFERENTIAL/PLATELET - Abnormal; Notable for the following components:   Abs Immature Granulocytes 0.08 (*)    All other components within normal limits     EKG: None  Radiology: No results found.  {Document cardiac monitor, telemetry assessment procedure when appropriate:32947} Procedures   Medications Ordered in the ED  dexamethasone  (DECADRON ) injection 10 mg (has no administration in time range)  metoCLOPramide (REGLAN) injection 10 mg (has no administration in time range)  magnesium sulfate IVPB 1 g 100 mL (has no administration in time range)      {Click here for ABCD2, HEART and other calculators REFRESH Note before signing:1}                              Medical Decision Making Amount and/or Complexity of Data Reviewed Radiology: ordered.  Risk Prescription drug  management.   This patient presents to the ED for concern of headache and jaw/neck swelling differential diagnosis includes abscess, headache, brain bleed, cellulitis    Additional history obtained   Additional history obtained from Electronic Medical Record External records from outside source obtained and reviewed including Care Everywhere   Lab Tests:  I Ordered, and personally interpreted labs.  The pertinent results include: CBC unremarkable, elevated creatinine at 1.10 consistent with CKD   Imaging Studies ordered:  I ordered imaging studies including CT head Noncon and CT soft tissue neck with contrast I independently visualized and interpreted imaging which showed Negative I agree with the radiologist interpretation   Medicines ordered and prescription drug management:  I ordered medication including migraine cocktail and albuterol  I have reviewed the patients home medicines and have made adjustments as needed   Problem List / ED Course:  ***  {Document critical care time when appropriate  Document review of labs and clinical decision tools ie [id number], etc  Document your independent review of radiology images and any outside records  Document your discussion with family members, caretakers and with consultants  Document  social determinants of health affecting pt's care  Document your decision making why or why not admission, treatments were needed:32947:::1}   Final diagnoses:  None    ED Discharge Orders     None

## 2024-07-14 DIAGNOSIS — L4050 Arthropathic psoriasis, unspecified: Secondary | ICD-10-CM | POA: Diagnosis not present

## 2024-07-14 DIAGNOSIS — Z0000 Encounter for general adult medical examination without abnormal findings: Secondary | ICD-10-CM | POA: Diagnosis not present

## 2024-07-14 DIAGNOSIS — Z23 Encounter for immunization: Secondary | ICD-10-CM | POA: Diagnosis not present

## 2024-07-14 DIAGNOSIS — K219 Gastro-esophageal reflux disease without esophagitis: Secondary | ICD-10-CM | POA: Diagnosis not present

## 2024-07-14 DIAGNOSIS — D849 Immunodeficiency, unspecified: Secondary | ICD-10-CM | POA: Diagnosis not present

## 2024-07-14 DIAGNOSIS — R7303 Prediabetes: Secondary | ICD-10-CM | POA: Diagnosis not present

## 2024-07-14 DIAGNOSIS — N1831 Chronic kidney disease, stage 3a: Secondary | ICD-10-CM | POA: Diagnosis not present

## 2024-07-14 DIAGNOSIS — J45909 Unspecified asthma, uncomplicated: Secondary | ICD-10-CM | POA: Diagnosis not present

## 2024-07-14 DIAGNOSIS — E7800 Pure hypercholesterolemia, unspecified: Secondary | ICD-10-CM | POA: Diagnosis not present

## 2024-07-18 DIAGNOSIS — I7300 Raynaud's syndrome without gangrene: Secondary | ICD-10-CM | POA: Diagnosis not present

## 2024-07-18 DIAGNOSIS — L4050 Arthropathic psoriasis, unspecified: Secondary | ICD-10-CM | POA: Diagnosis not present

## 2024-07-18 DIAGNOSIS — M1990 Unspecified osteoarthritis, unspecified site: Secondary | ICD-10-CM | POA: Diagnosis not present

## 2024-07-18 DIAGNOSIS — Z79899 Other long term (current) drug therapy: Secondary | ICD-10-CM | POA: Diagnosis not present

## 2024-10-13 ENCOUNTER — Encounter: Payer: Self-pay | Admitting: Physical Medicine and Rehabilitation

## 2024-11-07 ENCOUNTER — Encounter: Admitting: Physical Medicine and Rehabilitation

## 2024-11-22 ENCOUNTER — Ambulatory Visit: Admitting: Adult Health
